# Patient Record
Sex: Female | Born: 1950 | Race: White | Hispanic: No | Marital: Single | Smoking: Never smoker
Health system: Southern US, Community
[De-identification: ages and names within clinical notes are randomized; demographics above are authoritative.]

## PROBLEM LIST (undated history)

## (undated) DIAGNOSIS — IMO0002 Reserved for concepts with insufficient information to code with codable children: Secondary | ICD-10-CM

## (undated) DIAGNOSIS — Z8619 Personal history of other infectious and parasitic diseases: Secondary | ICD-10-CM

## (undated) DIAGNOSIS — Z8719 Personal history of other diseases of the digestive system: Secondary | ICD-10-CM

## (undated) DIAGNOSIS — M810 Age-related osteoporosis without current pathological fracture: Secondary | ICD-10-CM

## (undated) DIAGNOSIS — E785 Hyperlipidemia, unspecified: Secondary | ICD-10-CM

## (undated) HISTORY — DX: Personal history of other diseases of the digestive system: Z87.19

## (undated) HISTORY — DX: Age-related osteoporosis without current pathological fracture: M81.0

## (undated) HISTORY — DX: Reserved for concepts with insufficient information to code with codable children: IMO0002

## (undated) HISTORY — DX: Hyperlipidemia, unspecified: E78.5

## (undated) HISTORY — DX: Personal history of other infectious and parasitic diseases: Z86.19

---

## 1980-03-04 HISTORY — PX: TOTAL ABDOMINAL HYSTERECTOMY W/ BILATERAL SALPINGOOPHORECTOMY: SHX83

## 2003-03-05 HISTORY — PX: APPENDECTOMY: SHX54

## 2004-02-28 ENCOUNTER — Ambulatory Visit: Payer: Self-pay | Admitting: Unknown Physician Specialty

## 2004-03-02 ENCOUNTER — Ambulatory Visit: Payer: Self-pay | Admitting: Unknown Physician Specialty

## 2004-06-01 ENCOUNTER — Ambulatory Visit: Payer: Self-pay | Admitting: Unknown Physician Specialty

## 2004-12-06 ENCOUNTER — Ambulatory Visit: Payer: Self-pay | Admitting: Obstetrics and Gynecology

## 2005-01-24 ENCOUNTER — Other Ambulatory Visit: Payer: Self-pay

## 2005-01-24 ENCOUNTER — Emergency Department: Payer: Self-pay | Admitting: Emergency Medicine

## 2005-04-12 ENCOUNTER — Ambulatory Visit: Payer: Self-pay | Admitting: Internal Medicine

## 2005-12-09 ENCOUNTER — Ambulatory Visit: Payer: Self-pay | Admitting: Obstetrics and Gynecology

## 2006-12-15 ENCOUNTER — Ambulatory Visit: Payer: Self-pay | Admitting: Obstetrics and Gynecology

## 2007-12-17 ENCOUNTER — Ambulatory Visit: Payer: Self-pay | Admitting: Obstetrics and Gynecology

## 2008-12-21 ENCOUNTER — Ambulatory Visit: Payer: Self-pay | Admitting: Obstetrics and Gynecology

## 2009-04-12 ENCOUNTER — Emergency Department: Payer: Self-pay | Admitting: Emergency Medicine

## 2009-12-26 ENCOUNTER — Ambulatory Visit: Payer: Self-pay | Admitting: Obstetrics and Gynecology

## 2011-01-07 ENCOUNTER — Ambulatory Visit: Payer: Self-pay | Admitting: Obstetrics and Gynecology

## 2012-01-08 ENCOUNTER — Ambulatory Visit: Payer: Self-pay | Admitting: Obstetrics and Gynecology

## 2013-01-11 ENCOUNTER — Ambulatory Visit: Payer: Self-pay | Admitting: Obstetrics and Gynecology

## 2013-09-27 ENCOUNTER — Ambulatory Visit (INDEPENDENT_AMBULATORY_CARE_PROVIDER_SITE_OTHER): Payer: BC Managed Care – PPO | Admitting: Adult Health

## 2013-09-27 ENCOUNTER — Encounter: Payer: Self-pay | Admitting: Adult Health

## 2013-09-27 ENCOUNTER — Encounter (INDEPENDENT_AMBULATORY_CARE_PROVIDER_SITE_OTHER): Payer: Self-pay

## 2013-09-27 VITALS — BP 122/85 | HR 69 | Temp 97.9°F | Resp 14 | Ht 62.0 in | Wt 136.5 lb

## 2013-09-27 DIAGNOSIS — E7800 Pure hypercholesterolemia, unspecified: Secondary | ICD-10-CM

## 2013-09-27 DIAGNOSIS — Z1211 Encounter for screening for malignant neoplasm of colon: Secondary | ICD-10-CM

## 2013-09-27 DIAGNOSIS — M810 Age-related osteoporosis without current pathological fracture: Secondary | ICD-10-CM | POA: Insufficient documentation

## 2013-09-27 NOTE — Progress Notes (Signed)
Patient ID: Morgan Gallagher, female   DOB: [birth year], 63 y.o.   MRN: [id number]   Subjective:    Patient ID: Morgan Jaegerhyllis Vey, female    DOB: [birth year], 63 y.o.   MRN: [id number]  HPI  Pt is a 63 y/o female who is here to establish care. Medicare eligible in 12/2013. [facility]. Overall she is feeling well. She has been followed by her GYN for her yearly physical exams up until now. Has not had a PCP and was going to urgent care whenever had acute problem. Reports having stress secondary to caring for her daughter. The daughter is believed to have illness similar to ALS - not diagnosed. [facility]    Past Medical History  Diagnosis Date  . Ulcer   . Hyperlipidemia   . History of chicken pox   . History of diverticulitis   . Osteoporosis     No current outpatient prescriptions on file prior to visit.   No current facility-administered medications on file prior to visit.     Review of Systems  Constitutional: Negative.   HENT: Negative.   Eyes: Negative.   Respiratory: Negative.   Cardiovascular: Negative.   Gastrointestinal: Negative.   Endocrine: Negative.   Genitourinary: Negative.   Musculoskeletal: Negative.   Skin: Negative.   Allergic/Immunologic: Negative.   Neurological: Negative.   Hematological: Negative.   Psychiatric/Behavioral: Negative.        Stress secondary to daughter's illness       Objective:  BP 122/85  Pulse 69  Temp(Src) 97.9 F (36.6 C) (Oral)  Resp 14  Ht 5\' 2"  (1.575 m)  Wt 136 lb 8 oz (61.916 kg)  BMI 24.96 kg/m2  SpO2 99%   Physical Exam  Constitutional: She is oriented to person, place, and time. No distress.  HENT:  Head: Normocephalic and atraumatic.  Eyes: Conjunctivae and EOM are normal.  Neck: Normal range of motion. Neck supple.  Cardiovascular: Normal rate, regular rhythm, normal heart sounds and intact distal pulses.  Exam reveals no gallop and no friction rub.   No  murmur heard. Pulmonary/Chest: Effort normal and breath sounds normal. No respiratory distress. She has no wheezes. She has no rales.  Musculoskeletal: Normal range of motion.  Neurological: She is alert and oriented to person, place, and time. She has normal reflexes. Coordination normal.  Skin: Skin is warm and dry.  Psychiatric: She has a normal mood and affect. Her behavior is normal. Judgment and thought content normal.       Assessment & Plan:   1. Encounter for screening colonoscopy Refer to Dr. Mechele CollinElliott for screening colonocsopy. She has scar tissue from hysterectomy and last colonoscopy was unable to be performed. She reports having barium enema. May consider cologuard. - Ambulatory referral to Gastroenterology  2. OP (osteoporosis) Dexa scan scheduled after September. Hx of taking fosamax for many years.  3. Hypercholesteremia Taking atorvastatin daily. Recently had this checked. Follow.

## 2013-09-27 NOTE — Progress Notes (Signed)
Pre visit review using our clinic review tool, if applicable. No additional management support is needed unless otherwise documented below in the visit note. 

## 2013-09-27 NOTE — Patient Instructions (Signed)
   [facility] [place].  Please schedule your Welcome to Medicare Exam for October.

## 2013-11-10 ENCOUNTER — Telehealth: Payer: Self-pay | Admitting: Adult Health

## 2013-11-10 NOTE — Telephone Encounter (Signed)
Pt request Raquel to call her. She has some questions about appts.

## 2013-11-18 ENCOUNTER — Encounter: Payer: Self-pay | Admitting: Adult Health

## 2013-11-18 ENCOUNTER — Ambulatory Visit (INDEPENDENT_AMBULATORY_CARE_PROVIDER_SITE_OTHER): Payer: BC Managed Care – PPO | Admitting: Adult Health

## 2013-11-18 VITALS — BP 122/83 | HR 75 | Temp 97.7°F | Resp 14 | Ht 62.0 in | Wt 134.8 lb

## 2013-11-18 DIAGNOSIS — Z0000 Encounter for general adult medical examination without abnormal findings: Secondary | ICD-10-CM

## 2013-11-18 DIAGNOSIS — Z23 Encounter for immunization: Secondary | ICD-10-CM

## 2013-11-18 DIAGNOSIS — Z1211 Encounter for screening for malignant neoplasm of colon: Secondary | ICD-10-CM

## 2013-11-18 LAB — CBC WITH DIFFERENTIAL/PLATELET
BASOS ABS: 0 10*3/uL (ref 0.0–0.1)
Basophils Relative: 0.6 % (ref 0.0–3.0)
EOS PCT: 3.6 % (ref 0.0–5.0)
Eosinophils Absolute: 0.3 10*3/uL (ref 0.0–0.7)
HCT: 41.4 % (ref 36.0–46.0)
Hemoglobin: 13.8 g/dL (ref 12.0–15.0)
Lymphocytes Relative: 29.6 % (ref 12.0–46.0)
Lymphs Abs: 2.1 10*3/uL (ref 0.7–4.0)
MCHC: 33.4 g/dL (ref 30.0–36.0)
MCV: 91.9 fl (ref 78.0–100.0)
MONO ABS: 0.8 10*3/uL (ref 0.1–1.0)
Monocytes Relative: 11.5 % (ref 3.0–12.0)
NEUTROS PCT: 54.7 % (ref 43.0–77.0)
Neutro Abs: 3.8 10*3/uL (ref 1.4–7.7)
PLATELETS: 240 10*3/uL (ref 150.0–400.0)
RBC: 4.5 Mil/uL (ref 3.87–5.11)
RDW: 14 % (ref 11.5–15.5)
WBC: 7 10*3/uL (ref 4.0–10.5)

## 2013-11-18 LAB — BASIC METABOLIC PANEL
BUN: 15 mg/dL (ref 6–23)
CHLORIDE: 107 meq/L (ref 96–112)
CO2: 27 meq/L (ref 19–32)
Calcium: 9.4 mg/dL (ref 8.4–10.5)
Creatinine, Ser: 0.6 mg/dL (ref 0.4–1.2)
GFR: 99.02 mL/min (ref >60.00)
Glucose, Bld: 99 mg/dL (ref 70–99)
Potassium: 4.3 mEq/L (ref 3.5–5.1)
SODIUM: 143 meq/L (ref 135–145)

## 2013-11-18 LAB — HEPATIC FUNCTION PANEL
ALBUMIN: 4.5 g/dL (ref 3.5–5.2)
ALK PHOS: 89 U/L (ref 39–117)
ALT: 22 U/L (ref 0–35)
AST: 26 U/L (ref 0–37)
BILIRUBIN DIRECT: 0.1 mg/dL (ref 0.0–0.3)
TOTAL PROTEIN: 7.7 g/dL (ref 6.0–8.3)
Total Bilirubin: 0.5 mg/dL (ref 0.2–1.2)

## 2013-11-18 LAB — LIPID PANEL
CHOLESTEROL: 170 mg/dL (ref 0–200)
HDL: 48.2 mg/dL (ref >39.00)
LDL Cholesterol: 98 mg/dL (ref 0–99)
NonHDL: 121.8
Total CHOL/HDL Ratio: 4
Triglycerides: 119 mg/dL (ref 0.0–149.0)
VLDL: 23.8 mg/dL (ref 0.0–40.0)

## 2013-11-18 LAB — VITAMIN D 25 HYDROXY (VIT D DEFICIENCY, FRACTURES): VITD: 47.42 ng/mL (ref 30.00–100.00)

## 2013-11-18 LAB — VITAMIN B12: VITAMIN B 12: 853 pg/mL (ref 211–911)

## 2013-11-18 LAB — TSH: TSH: 0.96 u[IU]/mL (ref 0.35–4.50)

## 2013-11-18 MED ORDER — ATORVASTATIN CALCIUM 10 MG PO TABS: 10.0000 mg | ORAL_TABLET | Freq: Every day | ORAL | Status: DC

## 2013-11-18 NOTE — Patient Instructions (Signed)
  Please have your labs drawn prior to leaving the office today. We will contact you with the results. We can have them mailed to you or you could come by the office and get a copy for your insurance.  I am referring you for:   Bone Density  Mammogram  GI - screening colonoscopy  Please see Eber Jones to schedule the bone density and mammogram. Dr. Earnest Conroy office should contact you for an appointment. If you have not heard back within 2 weeks, please call our office for a status on your referral.  For your insurance, list Dr. Ronna Polio as your primary physician  You received your tetanus and flu vaccines today.

## 2013-11-18 NOTE — Progress Notes (Signed)
Pre visit review using our clinic review tool, if applicable. No additional management support is needed unless otherwise documented below in the visit note. 

## 2013-11-18 NOTE — Progress Notes (Signed)
Patient ID: Morgan Gallagher, female   DOB: September 27, 1950, 63 y.o.   MRN: [id number]   Subjective:    Patient ID: Morgan Gallagher, female    DOB: 25-Jul-1950, 63 y.o.   MRN: [id number]  HPI  Pt is a very pleasant 63 y/o female who presents to clinic for her annual physical including labs. She is due for her colonoscopy, mammogram, bone density, tetanus vaccine and flu vaccine. She had her shingles vaccine in November 2014. Pt is feeling well overall. Some stress related to her daughter's health but she is coping well.  She is due to renew her health insurance next month and will need a physician to list from our practice as a PCP. I have discussed this with Dr. Dan Humphreys who has agreed that patient list her as PCP since I am leaving the practice.    Past Medical History  Diagnosis Date  . Ulcer   . Hyperlipidemia   . History of chicken pox   . History of diverticulitis   . Osteoporosis      Past Surgical History  Procedure Laterality Date  . Appendectomy  2005  . Total abdominal hysterectomy w/ bilateral salpingoophorectomy  1982     Family History  Problem Relation Age of Onset  . Hyperlipidemia Mother   . Diabetes Mother   . Stroke Mother   . Hyperlipidemia Father   . Heart disease Father 93    died  . Alcohol abuse Father   . Cancer Maternal Aunt     ovarian  . Hypertension Paternal Aunt   . Hyperlipidemia Maternal Grandmother   . Early death Brother 3    Run over by his mother     History   Social History  . Marital Status: Divorced    Spouse Name: N/A    Number of Children: 2  . Years of Education: 13   Occupational History  . Teacher Retail buyer   Social History Main Topics  . Smoking status: Never Smoker   . Smokeless tobacco: Never Used  . Alcohol Use: No  . Drug Use: No  . Sexual Activity: Not on file   Other Topics Concern  . Not on file   Social History Narrative   Winta grew up in [place]. She is divorced as has 2  daughter [name], [name]). She lives in [place]. [name] is living with her 2/2 illness (possible ALS). She works as a Research officer, trade [employer].       Hobbies: stainglass          Current Outpatient Prescriptions on File Prior to Visit  Medication Sig Dispense Refill  . Calcium Carbonate-Vit D-Min (CALTRATE 600+D PLUS PO) Take 1 tablet by mouth 2 (two) times daily.      . Flaxseed, Linseed, (FLAXSEED OIL) 1000 MG CAPS Take 1 capsule by mouth daily.      . Multiple Vitamins-Minerals (CENTRUM SILVER ADULT 50+ PO) Take 1 tablet by mouth daily.      . Omega-3 Fatty Acids (FISH OIL) 1200 MG CAPS Take 1 capsule by mouth daily.      . psyllium (METAMUCIL) 58.6 % powder Take 1 packet by mouth daily.      . vitamin E 400 UNIT capsule Take 400 Units by mouth daily.       No current facility-administered medications on file prior to visit.     Review of Systems  Constitutional: Negative.   HENT: Negative.   Eyes: Negative.   Respiratory: Negative.  Cardiovascular: Negative.   Gastrointestinal:       Occasional indigestion which she takes OTC meds with good control.  Endocrine: Negative.   Genitourinary: Negative.   Musculoskeletal: Negative.   Skin: Negative.   Allergic/Immunologic: Negative.   Neurological: Negative.   Hematological: Negative.   Psychiatric/Behavioral: Negative.        Objective:  BP 122/83  Pulse 75  Temp(Src) 97.7 F (36.5 C) (Oral)  Resp 14  Ht  (1.575 m)  Wt 134 lb 12 oz (61.122 kg)  BMI 24.64 kg/m2  SpO2 100%   Physical Exam  Constitutional: She is oriented to person, place, and time. She appears well-developed and well-nourished. No distress.  HENT:  Head: Normocephalic and atraumatic.  Right Ear: External ear normal.  Left Ear: External ear normal.  Nose: Nose normal.  Mouth/Throat: Oropharynx is clear and moist.  Eyes: Conjunctivae and EOM are normal. Pupils are equal, round, and reactive to light.  Neck: Normal range of motion. Neck supple.  No tracheal deviation present. No thyromegaly present.  Cardiovascular: Normal rate, regular rhythm, normal heart sounds and intact distal pulses.  Exam reveals no gallop and no friction rub.   No murmur heard. Pulmonary/Chest: Effort normal and breath sounds normal. No respiratory distress. She has no wheezes. She has no rales. Right breast exhibits no inverted nipple, no mass, no nipple discharge, no skin change and no tenderness. Left breast exhibits no inverted nipple, no mass, no nipple discharge, no skin change and no tenderness. Breasts are symmetrical.  Abdominal: Soft. Bowel sounds are normal. She exhibits no distension and no mass. There is no tenderness. There is no rebound and no guarding.  Musculoskeletal: Normal range of motion. She exhibits no edema and no tenderness.  Lymphadenopathy:    She has no cervical adenopathy.  Neurological: She is alert and oriented to person, place, and time. She has normal reflexes. No cranial nerve deficit. Coordination normal.  Skin: Skin is warm and dry.  Psychiatric: She has a normal mood and affect. Her behavior is normal. Judgment and thought content normal.      Assessment & Plan:   1. Routine general medical examination at a health care facility Normal physical exam. Screenings addressed separately. Breast exam normal. Check labs - MM DIGITAL SCREENING BILATERAL; Future - TSH - Vit D  25 hydroxy (rtn osteoporosis monitoring) - CBC with Differential - Lipid panel - Vitamin B12 - Basic metabolic panel - Hepatic function panel - DG Bone Density; Future  2. Screening for colon cancer Refer to Dr. Mechele Collin. Pt has seen him in the past for her colonoscopies - Ambulatory referral to Gastroenterology  3. Need for prophylactic vaccination and inoculation against influenza Given in clinic - Flu Vaccine QUAD 36+ mos PF IM (Fluarix Quad PF)  4. Need for TD vaccine Given in clinic - Td vaccine preservative free greater than or equal to 7yo  IM

## 2013-11-19 ENCOUNTER — Encounter: Payer: Self-pay | Admitting: *Deleted

## 2014-01-09 LAB — COLOGUARD

## 2014-01-13 ENCOUNTER — Ambulatory Visit: Payer: Self-pay | Admitting: Adult Health

## 2014-01-16 ENCOUNTER — Telehealth: Payer: Self-pay | Admitting: Internal Medicine

## 2014-01-16 NOTE — Telephone Encounter (Signed)
This is formerly Raquel's patient but Her mammogram was abnormal on the right.  They  want additional images, which I have ordered.  I prefer to choose the surgeon with the patient if a biopsy is recommended, so if they tell her she needs a biopsy, she can tell them she is going to discuss it with me first.

## 2014-01-17 ENCOUNTER — Ambulatory Visit: Payer: Self-pay | Admitting: Adult Health

## 2014-01-17 ENCOUNTER — Encounter: Payer: Self-pay | Admitting: *Deleted

## 2014-01-17 LAB — HM DEXA SCAN

## 2014-01-17 NOTE — Telephone Encounter (Signed)
Spoke to patient to notify her of Dr. Melina Schoolsullo's comments. Patient verbalized understanding. Patient stated that she is concerned about them needing additional images. I advised patient not to worry and we will contact her after we receive the images. Patient felt better before we got off the phone.

## 2014-01-17 NOTE — Telephone Encounter (Signed)
Left message for patient to return call to office. 

## 2014-01-18 ENCOUNTER — Ambulatory Visit: Payer: Self-pay | Admitting: Adult Health

## 2014-01-20 ENCOUNTER — Telehealth: Payer: Self-pay | Admitting: Internal Medicine

## 2014-01-20 DIAGNOSIS — M810 Age-related osteoporosis without current pathological fracture: Secondary | ICD-10-CM

## 2014-01-20 NOTE — Assessment & Plan Note (Signed)
DEXA scan done Nov 2015 shows T scores may have improved and are now -2/0 i the spine  And - 1.7 in the hip.  continue current therapy , make appt with Naomie Deanarrie Doss to review

## 2014-01-20 NOTE — Telephone Encounter (Signed)
Bone Density study done Nov 2015 shows T scores may have improved and are now -2/0 i the spine  And - 1.7 in the hip.  continue current therapy , make appt with Naomie Deanarrie Doss to review

## 2014-01-21 NOTE — Telephone Encounter (Signed)
Spoke to patient to notify her of Dr. Melina Schoolsullo's comments. Patient verbalized understanding. Patient stated she does not want to schedule an appt at this time. Will call the office when she is ready.

## 2014-02-03 ENCOUNTER — Encounter: Payer: Self-pay | Admitting: Adult Health

## 2014-02-10 ENCOUNTER — Encounter: Payer: Self-pay | Admitting: Adult Health

## 2014-02-23 ENCOUNTER — Encounter: Payer: Self-pay | Admitting: Adult Health

## 2014-06-06 ENCOUNTER — Other Ambulatory Visit: Payer: Self-pay

## 2014-06-06 MED ORDER — ATORVASTATIN CALCIUM 10 MG PO TABS: 10.0000 mg | ORAL_TABLET | Freq: Every day | ORAL | Status: DC

## 2014-11-21 ENCOUNTER — Ambulatory Visit (INDEPENDENT_AMBULATORY_CARE_PROVIDER_SITE_OTHER): Payer: BC Managed Care – PPO | Admitting: Nurse Practitioner

## 2014-11-21 ENCOUNTER — Encounter: Payer: Self-pay | Admitting: Nurse Practitioner

## 2014-11-21 VITALS — BP 120/78 | HR 71 | Temp 98.1°F | Resp 14 | Ht 62.0 in | Wt 138.8 lb

## 2014-11-21 DIAGNOSIS — Z23 Encounter for immunization: Secondary | ICD-10-CM

## 2014-11-21 DIAGNOSIS — F4323 Adjustment disorder with mixed anxiety and depressed mood: Secondary | ICD-10-CM

## 2014-11-21 DIAGNOSIS — K219 Gastro-esophageal reflux disease without esophagitis: Secondary | ICD-10-CM | POA: Diagnosis not present

## 2014-11-21 DIAGNOSIS — Z0000 Encounter for general adult medical examination without abnormal findings: Secondary | ICD-10-CM | POA: Diagnosis not present

## 2014-11-21 DIAGNOSIS — M810 Age-related osteoporosis without current pathological fracture: Secondary | ICD-10-CM

## 2014-11-21 DIAGNOSIS — E7800 Pure hypercholesterolemia, unspecified: Secondary | ICD-10-CM

## 2014-11-21 LAB — COMPREHENSIVE METABOLIC PANEL
ALT: 20 U/L (ref 0–35)
AST: 23 U/L (ref 0–37)
Albumin: 4.4 g/dL (ref 3.5–5.2)
Alkaline Phosphatase: 88 U/L (ref 39–117)
BUN: 17 mg/dL (ref 6–23)
CALCIUM: 9.7 mg/dL (ref 8.4–10.5)
CHLORIDE: 107 meq/L (ref 96–112)
CO2: 28 meq/L (ref 19–32)
CREATININE: 0.72 mg/dL (ref 0.40–1.20)
GFR: 86.16 mL/min (ref >60.00)
Glucose, Bld: 97 mg/dL (ref 70–99)
Potassium: 4.8 mEq/L (ref 3.5–5.1)
SODIUM: 142 meq/L (ref 135–145)
Total Bilirubin: 0.4 mg/dL (ref 0.2–1.2)
Total Protein: 7.6 g/dL (ref 6.0–8.3)

## 2014-11-21 LAB — LIPID PANEL
CHOL/HDL RATIO: 3
Cholesterol: 182 mg/dL (ref 0–200)
HDL: 54.7 mg/dL (ref >39.00)
LDL CALC: 100 mg/dL — AB (ref 0–99)
NonHDL: 126.99
TRIGLYCERIDES: 135 mg/dL (ref 0.0–149.0)
VLDL: 27 mg/dL (ref 0.0–40.0)

## 2014-11-21 LAB — VITAMIN D 25 HYDROXY (VIT D DEFICIENCY, FRACTURES): VITD: 47.4 ng/mL (ref 30.00–100.00)

## 2014-11-21 MED ORDER — ATORVASTATIN CALCIUM 10 MG PO TABS: 10.0000 mg | ORAL_TABLET | Freq: Every day | ORAL | Status: DC

## 2014-11-21 NOTE — Progress Notes (Signed)
Patient ID: Morgan Gallagher, female    DOB: Mar 18, 1950  Age: 64 y.o. MRN: [id number]  CC: Annual Exam   HPI [name] presents for annual physical exam.   1) Health Maintenance-   Diet- Working on healthy habits  Exercise- 10,000 steps daily, goes to classes and walks  Immunizations- Flu- will obtain today    Zostavax- 2014    Pneumonia-   Mammogram- 2015, due in Nov.   Pap- Hysterectomy   Bone Density- 2015  Colonoscopy- Cologuard negative in 2015, repeat in 3 years  Eye Exam- Not UTD  Dental Exam- UTD  Hep C and HIV screening- Declined  Refills: Lipitor- 1 yr requesting  2) Friday felt sick on her stomach and went home from work. Saturday morning took mylanta. Hx of ulcer in 1980's due to stress and raw vegetables. Belching more often and epigastric pain.  Very stressed out recently due to impending retirement, she is estranged from her brother and sister due to finances from Mother's will. Mother passed earlier this year. Reports feeling overwhelmed, but has a good supportive relationship with husband.   History Dickie has a past medical history of Ulcer; Hyperlipidemia; History of chicken pox; History of diverticulitis; and Osteoporosis.   She has past surgical history that includes Appendectomy (2005) and Total abdominal hysterectomy w/ bilateral salpingoophorectomy (4098).   Her family history includes Alcohol abuse in her father; Cancer in her maternal aunt; Diabetes in her mother; Early death (age of onset: 3) in her brother; Heart disease (age of onset: 82) in her father; Hyperlipidemia in her father, maternal grandmother, and mother; Hypertension in her paternal aunt; Stroke in her mother.She reports that she has never smoked. She has never used smokeless tobacco. She reports that she does not drink alcohol or use illicit drugs.  Outpatient Prescriptions Prior to Visit  Medication Sig Dispense Refill  . Ascorbic Acid (VITAMIN C) 1000 MG tablet Take 1,000 mg by  mouth daily.    . Calcium Carbonate-Vit D-Min (CALTRATE 600+D PLUS PO) Take 1 tablet by mouth 2 (two) times daily.    . Flaxseed, Linseed, (FLAXSEED OIL) 1000 MG CAPS Take 1 capsule by mouth daily.    . Multiple Vitamins-Minerals (CENTRUM SILVER ADULT 50+ PO) Take 1 tablet by mouth daily.    . Omega-3 Fatty Acids (FISH OIL) 1200 MG CAPS Take 1 capsule by mouth daily.    . psyllium (METAMUCIL) 58.6 % powder Take 1 packet by mouth daily.    . vitamin E 400 UNIT capsule Take 400 Units by mouth daily.    Marland Kitchen atorvastatin (LIPITOR) 10 MG tablet Take 1 tablet (10 mg total) by mouth daily. 30 tablet 5   No facility-administered medications prior to visit.    ROS Review of Systems  Constitutional: Negative for fever, chills, diaphoresis, fatigue and unexpected weight change.  HENT: Negative for tinnitus and trouble swallowing.   Eyes: Negative for visual disturbance.  Respiratory: Negative for cough, chest tightness and wheezing.   Cardiovascular: Negative for chest pain, palpitations and leg swelling.  Gastrointestinal: Positive for vomiting and abdominal pain. Negative for nausea, diarrhea, constipation, blood in stool and abdominal distention.       Vomited once end of last week Patient reporting increased acid reflux Epigastric pain  Genitourinary: Negative for dysuria, hematuria, vaginal discharge and vaginal pain.  Musculoskeletal: Negative for myalgias, back pain, arthralgias and gait problem.  Skin: Negative for color change and rash.  Neurological: Negative for dizziness, weakness, numbness and headaches.  Hematological:  Does not bruise/bleed easily.  Psychiatric/Behavioral: Negative for suicidal ideas and sleep disturbance. The patient is nervous/anxious.    Objective:  BP 120/78 mmHg  Pulse 71  Temp(Src) 98.1 F (36.7 C)  Resp 14  Ht  (1.575 m)  Wt 138 lb 12.8 oz (62.959 kg)  BMI 25.38 kg/m2  SpO2 96%  Physical Exam  Constitutional: She is oriented to person, place,  and time. She appears well-developed and well-nourished. No distress.  HENT:  Head: Normocephalic and atraumatic.  Right Ear: External ear normal.  Left Ear: External ear normal.  Nose: Nose normal.  Mouth/Throat: Oropharynx is clear and moist. No oropharyngeal exudate.  TMs and canals clear bilaterally  Eyes: Conjunctivae and EOM are normal. Pupils are equal, round, and reactive to light. Right eye exhibits no discharge. Left eye exhibits no discharge. No scleral icterus.  Neck: Normal range of motion. Neck supple. No thyromegaly present.  Cardiovascular: Normal rate, regular rhythm, normal heart sounds and intact distal pulses.  Exam reveals no gallop and no friction rub.   No murmur heard. Pulmonary/Chest: Effort normal and breath sounds normal. No respiratory distress. She has no wheezes. She has no rales. She exhibits no tenderness.  No significant findings on breast exam  Abdominal: Soft. Bowel sounds are normal. She exhibits no distension and no mass. There is no tenderness. There is no rebound and no guarding.  Genitourinary:  Deferred due to age and denying symptoms  Musculoskeletal: Normal range of motion. She exhibits no edema or tenderness.  Lymphadenopathy:    She has no cervical adenopathy.  Neurological: She is alert and oriented to person, place, and time. She has normal reflexes. No cranial nerve deficit. She exhibits normal muscle tone. Coordination normal.  Skin: Skin is warm and dry. No rash noted. She is not diaphoretic. No erythema. No pallor.  Psychiatric: She has a normal mood and affect. Her behavior is normal. Judgment and thought content normal.  Tearful    Assessment & Plan:   [name] was seen today for annual exam.  Diagnoses and all orders for this visit:  Routine general medical examination at a health care facility -     CBC with Differential/Platelet -     Comprehensive metabolic panel -     Hemoglobin A1c -     Lipid panel -     Vit D  25 hydroxy  (rtn osteoporosis monitoring)  Osteoporosis -     Vit D  25 hydroxy (rtn osteoporosis monitoring)  OP (osteoporosis)  Adjustment disorder with mixed anxiety and depressed mood  Gastroesophageal reflux disease without esophagitis  Hypercholesteremia  Other orders -     atorvastatin (LIPITOR) 10 MG tablet; Take 1 tablet (10 mg total) by mouth daily.   I am having Ms. Oros maintain her Flaxseed Oil, Multiple Vitamins-Minerals (CENTRUM SILVER ADULT 50+ PO), vitamin E, Fish Oil, Calcium Carbonate-Vit D-Min (CALTRATE 600+D PLUS PO), psyllium, vitamin C, and atorvastatin.  Meds ordered this encounter  Medications  . atorvastatin (LIPITOR) 10 MG tablet    Sig: Take 1 tablet (10 mg total) by mouth daily.    Dispense:  30 tablet    Refill:  11    Order Specific Question:  Supervising Provider    Answer:  Sherlene Shams [[id number]]     Follow-up: Return in about 1 year (around 11/21/2015) for CPE with labs.

## 2014-11-21 NOTE — Progress Notes (Signed)
Pre visit review using our clinic review tool, if applicable. No additional management support is needed unless otherwise documented below in the visit note. 

## 2014-11-21 NOTE — Assessment & Plan Note (Signed)
Pt declines any intervention at this time. She wants to try food elimination and mylanta as needed. I suggested Zantac OTC for reflux and she was interested in trying this. AVS has food list for pt to review that may influence reflux. Will follow.

## 2014-11-21 NOTE — Assessment & Plan Note (Signed)
Stable on Lipitor and omega 3s. She is working on diet and exercise to lose the 4 lbs she has gained this year. Will follow

## 2014-11-21 NOTE — Assessment & Plan Note (Signed)
Discussed with pt. She is happy with progress. She is taking vitamins.

## 2014-11-21 NOTE — Patient Instructions (Signed)
Food Choices for Gastroesophageal Reflux Disease When you have gastroesophageal reflux disease (GERD), the foods you eat and your eating habits are very important. Choosing the right foods can help ease the discomfort of GERD. WHAT GENERAL GUIDELINES DO I NEED TO FOLLOW?  Choose fruits, vegetables, whole grains, low-fat dairy products, and low-fat meat, fish, and poultry.  Limit fats such as oils, salad dressings, butter, nuts, and avocado.  Keep a food diary to identify foods that cause symptoms.  Avoid foods that cause reflux. These may be different for different people.  Eat frequent small meals instead of three large meals each day.  Eat your meals slowly, in a relaxed setting.  Limit fried foods.  Cook foods using methods other than frying.  Avoid drinking alcohol.  Avoid drinking large amounts of liquids with your meals.  Avoid bending over or lying down until 2-3 hours after eating. WHAT FOODS ARE NOT RECOMMENDED? The following are some foods and drinks that may worsen your symptoms: Vegetables Tomatoes. Tomato juice. Tomato and spaghetti sauce. Chili peppers. Onion and garlic. Horseradish. Fruits Oranges, grapefruit, and lemon (fruit and juice). Meats High-fat meats, fish, and poultry. This includes hot dogs, ribs, ham, sausage, salami, and bacon. Dairy Whole milk and chocolate milk. Sour cream. Cream. Butter. Ice cream. Cream cheese.  Beverages Coffee and tea, with or without caffeine. Carbonated beverages or energy drinks. Condiments Hot sauce. Barbecue sauce.  Sweets/Desserts Chocolate and cocoa. Donuts. Peppermint and spearmint. Fats and Oils High-fat foods, including [place] fries and potato chips. Other Vinegar. Strong spices, such as black pepper, white pepper, red pepper, cayenne, curry powder, cloves, ginger, and chili powder. The items listed above may not be a complete list of foods and beverages to avoid. Contact your dietitian for more  information.   Health Maintenance Adopting a healthy lifestyle and getting preventive care can go a long way to promote health and wellness. Talk with your health care provider about what schedule of regular examinations is right for you. This is a good chance for you to check in with your provider about disease prevention and staying healthy. In between checkups, there are plenty of things you can do on your own. Experts have done a lot of research about which lifestyle changes and preventive measures are most likely to keep you healthy. Ask your health care provider for more information. WEIGHT AND DIET  Eat a healthy diet  Be sure to include plenty of vegetables, fruits, low-fat dairy products, and lean protein.  Do not eat a lot of foods high in solid fats, added sugars, or salt.  Get regular exercise. This is one of the most important things you can do for your health.  Most adults should exercise for at least 150 minutes each week. The exercise should increase your heart rate and make you sweat (moderate-intensity exercise).  Most adults should also do strengthening exercises at least twice a week. This is in addition to the moderate-intensity exercise.  Maintain a healthy weight  Body mass index (BMI) is a measurement that can be used to identify possible weight problems. It estimates body fat based on height and weight. Your health care provider can help determine your BMI and help you achieve or maintain a healthy weight.  For females 13 years of age and older:   A BMI below 18.5 is considered underweight.  A BMI of 18.5 to 24.9 is normal.  A BMI of 25 to 29.9 is considered overweight.  A BMI of 30 and above  is considered obese.  Watch levels of cholesterol and blood lipids  You should start having your blood tested for lipids and cholesterol at 66 years of age, then have this test every 5 years.  You may need to have your cholesterol levels checked more often  if:  Your lipid or cholesterol levels are high.  You are older than 66 years of age.  You are at high risk for heart disease.  CANCER SCREENING   Lung Cancer  Lung cancer screening is recommended for adults 47-67 years old who are at high risk for lung cancer because of a history of smoking.  A yearly low-dose CT scan of the lungs is recommended for people who:  Currently smoke.  Have quit within the past 15 years.  Have at least a 30-pack-year history of smoking. A pack year is smoking an average of one pack of cigarettes a day for 1 year.  Yearly screening should continue until it has been 15 years since you quit.  Yearly screening should stop if you develop a health problem that would prevent you from having lung cancer treatment.  Breast Cancer  Practice breast self-awareness. This means understanding how your breasts normally appear and feel.  It also means doing regular breast self-exams. Let your health care provider know about any changes, no matter how small.  If you are in your 20s or 30s, you should have a clinical breast exam (CBE) by a health care provider every 1-3 years as part of a regular health exam.  If you are 59 or older, have a CBE every year. Also consider having a breast X-ray (mammogram) every year.  If you have a family history of breast cancer, talk to your health care provider about genetic screening.  If you are at high risk for breast cancer, talk to your health care provider about having an MRI and a mammogram every year.  Breast cancer gene (BRCA) assessment is recommended for women who have family members with BRCA-related cancers. BRCA-related cancers include:  Breast.  Ovarian.  Tubal.  Peritoneal cancers.  Results of the assessment will determine the need for genetic counseling and BRCA1 and BRCA2 testing. Cervical Cancer Routine pelvic examinations to screen for cervical cancer are no longer recommended for nonpregnant women  who are considered low risk for cancer of the pelvic organs (ovaries, uterus, and vagina) and who do not have symptoms. A pelvic examination may be necessary if you have symptoms including those associated with pelvic infections. Ask your health care provider if a screening pelvic exam is right for you.   The Pap test is the screening test for cervical cancer for women who are considered at risk.  If you had a hysterectomy for a problem that was not cancer or a condition that could lead to cancer, then you no longer need Pap tests.  If you are older than 65 years, and you have had normal Pap tests for the past 10 years, you no longer need to have Pap tests.  If you have had past treatment for cervical cancer or a condition that could lead to cancer, you need Pap tests and screening for cancer for at least 20 years after your treatment.  If you no longer get a Pap test, assess your risk factors if they change (such as having a new sexual partner). This can affect whether you should start being screened again.  Some women have medical problems that increase their chance of getting cervical cancer. If this  is the case for you, your health care provider may recommend more frequent screening and Pap tests.  The human papillomavirus (HPV) test is another test that may be used for cervical cancer screening. The HPV test looks for the virus that can cause cell changes in the cervix. The cells collected during the Pap test can be tested for HPV.  The HPV test can be used to screen women 45 years of age and older. Getting tested for HPV can extend the interval between normal Pap tests from three to five years.  An HPV test also should be used to screen women of any age who have unclear Pap test results.  After 67 years of age, women should have HPV testing as often as Pap tests.  Colorectal Cancer  This type of cancer can be detected and often prevented.  Routine colorectal cancer screening usually  begins at 67 years of age and continues through 66 years of age.  Your health care provider may recommend screening at an earlier age if you have risk factors for colon cancer.  Your health care provider may also recommend using home test kits to check for hidden blood in the stool.  A small camera at the end of a tube can be used to examine your colon directly (sigmoidoscopy or colonoscopy). This is done to check for the earliest forms of colorectal cancer.  Routine screening usually begins at age 32.  Direct examination of the colon should be repeated every 5-10 years through 66 years of age. However, you may need to be screened more often if early forms of precancerous polyps or small growths are found. Skin Cancer  Check your skin from head to toe regularly.  Tell your health care provider about any new moles or changes in moles, especially if there is a change in a mole's shape or color.  Also tell your health care provider if you have a mole that is larger than the size of a pencil eraser.  Always use sunscreen. Apply sunscreen liberally and repeatedly throughout the day.  Protect yourself by wearing long sleeves, pants, a wide-brimmed hat, and sunglasses whenever you are outside. HEART DISEASE, DIABETES, AND HIGH BLOOD PRESSURE   Have your blood pressure checked at least every 1-2 years. High blood pressure causes heart disease and increases the risk of stroke.  If you are between 17 years and 45 years old, ask your health care provider if you should take aspirin to prevent strokes.  Have regular diabetes screenings. This involves taking a blood sample to check your fasting blood sugar level.  If you are at a normal weight and have a low risk for diabetes, have this test once every three years after 66 years of age.  If you are overweight and have a high risk for diabetes, consider being tested at a younger age or more often. PREVENTING INFECTION  Hepatitis B  If you have a  higher risk for hepatitis B, you should be screened for this virus. You are considered at high risk for hepatitis B if:  You were born in a country where hepatitis B is common. Ask your health care provider which countries are considered high risk.  Your parents were born in a high-risk country, and you have not been immunized against hepatitis B (hepatitis B vaccine).  You have HIV or AIDS.  You use needles to inject street drugs.  You live with someone who has hepatitis B.  You have had sex with someone who  has hepatitis B.  You get hemodialysis treatment.  You take certain medicines for conditions, including cancer, organ transplantation, and autoimmune conditions. Hepatitis C  Blood testing is recommended for:  Everyone born from [birth year] through 1967.  Anyone with known risk factors for hepatitis C. Sexually transmitted infections (STIs)  You should be screened for sexually transmitted infections (STIs) including gonorrhea and chlamydia if:  You are sexually active and are younger than 66 years of age.  You are older than 66 years of age and your health care provider tells you that you are at risk for this type of infection.  Your sexual activity has changed since you were last screened and you are at an increased risk for chlamydia or gonorrhea. Ask your health care provider if you are at risk.  If you do not have HIV, but are at risk, it may be recommended that you take a prescription medicine daily to prevent HIV infection. This is called pre-exposure prophylaxis (PrEP). You are considered at risk if:  You are sexually active and do not regularly use condoms or know the HIV status of your partner(s).  You take drugs by injection.  You are sexually active with a partner who has HIV. Talk with your health care provider about whether you are at high risk of being infected with HIV. If you choose to begin PrEP, you should first be tested for HIV. You should then be tested  every 3 months for as long as you are taking PrEP.  PREGNANCY   If you are premenopausal and you may become pregnant, ask your health care provider about preconception counseling.  If you may become pregnant, take 400 to 800 micrograms (mcg) of folic acid every day.  If you want to prevent pregnancy, talk to your health care provider about birth control (contraception). OSTEOPOROSIS AND MENOPAUSE   Osteoporosis is a disease in which the bones lose minerals and strength with aging. This can result in serious bone fractures. Your risk for osteoporosis can be identified using a bone density scan.  If you are 16 years of age or older, or if you are at risk for osteoporosis and fractures, ask your health care provider if you should be screened.  Ask your health care provider whether you should take a calcium or vitamin D supplement to lower your risk for osteoporosis.  Menopause may have certain physical symptoms and risks.  Hormone replacement therapy may reduce some of these symptoms and risks. Talk to your health care provider about whether hormone replacement therapy is right for you.  HOME CARE INSTRUCTIONS   Schedule regular health, dental, and eye exams.  Stay current with your immunizations.   Do not use any tobacco products including cigarettes, chewing tobacco, or electronic cigarettes.  If you are pregnant, do not drink alcohol.  If you are breastfeeding, limit how much and how often you drink alcohol.  Limit alcohol intake to no more than 1 drink per day for nonpregnant women. One drink equals 12 ounces of beer, 5 ounces of wine, or 1 ounces of hard liquor.  Do not use street drugs.  Do not share needles.  Ask your health care provider for help if you need support or information about quitting drugs.  Tell your health care provider if you often feel depressed.  Tell your health care provider if you have ever been abused or do not feel safe at home. Document  Released: 09/03/2010 Document Revised: 07/05/2013 Document Reviewed: 01/20/2013 Exit[employer] Patient Information 2015  [employer]. This information is not intended to replace advice given to you by your health care provider. Make sure you discuss any questions you have with your health care provider.

## 2014-11-21 NOTE — Assessment & Plan Note (Addendum)
Patient is experiencing a hard year with multiple stressors. She is tearful. She has support. She declines counseling or help with medications at this time. I let her know that we are here any time she needs [place]

## 2014-11-21 NOTE — Assessment & Plan Note (Signed)
Discussed acute and chronic issues. Reviewed health maintenance measures, PFSHx, and immunizations. Obtain routine labs Vitamin D, Lipid panel, CBC w/ diff, A1c, and CMET.   Breast exam- clinical- completed.  Mammogram due in Nov.  No PAP due to age and hysterectomy Declined Hep C and HIV screening today Declined Pna vaccine Accepted Flu vaccine

## 2014-11-25 LAB — CBC WITH DIFFERENTIAL/PLATELET
BASOS PCT: 0.6 % (ref 0.0–3.0)
Basophils Absolute: 0 10*3/uL (ref 0.0–0.1)
EOS PCT: 6.2 % — AB (ref 0.0–5.0)
Eosinophils Absolute: 0.5 10*3/uL (ref 0.0–0.7)
HEMATOCRIT: 44.2 % (ref 36.0–46.0)
HEMOGLOBIN: 14.6 g/dL (ref 12.0–15.0)
LYMPHS PCT: 22.6 % (ref 12.0–46.0)
Lymphs Abs: 1.8 10*3/uL (ref 0.7–4.0)
MCHC: 33 g/dL (ref 30.0–36.0)
MCV: 93.5 fl (ref 78.0–100.0)
MONOS PCT: 9.7 % (ref 3.0–12.0)
Monocytes Absolute: 0.8 10*3/uL (ref 0.1–1.0)
Neutro Abs: 4.9 10*3/uL (ref 1.4–7.7)
Neutrophils Relative %: 60.9 % (ref 43.0–77.0)
Platelets: 231 10*3/uL (ref 150.0–400.0)
RBC: 4.73 Mil/uL (ref 3.87–5.11)
RDW: 13.6 % (ref 11.5–15.5)
WBC: 8.1 10*3/uL (ref 4.0–10.5)

## 2014-11-25 LAB — HEMOGLOBIN A1C: Hgb A1c MFr Bld: 5.7 % (ref 4.6–6.5)

## 2014-12-27 ENCOUNTER — Telehealth: Payer: Self-pay

## 2014-12-27 ENCOUNTER — Other Ambulatory Visit: Payer: Self-pay | Admitting: Nurse Practitioner

## 2014-12-27 DIAGNOSIS — Z1239 Encounter for other screening for malignant neoplasm of breast: Secondary | ICD-10-CM

## 2014-12-27 NOTE — Telephone Encounter (Signed)
Melissa gave me a paper and told me the pt needs new order for mammogram, thank you

## 2015-01-16 ENCOUNTER — Ambulatory Visit
Admission: RE | Admit: 2015-01-16 | Discharge: 2015-01-16 | Disposition: A | Discharge status: Home / Self Care | Payer: BC Managed Care – PPO | Source: Ambulatory Visit | Attending: Nurse Practitioner | Admitting: Nurse Practitioner

## 2015-01-16 ENCOUNTER — Ambulatory Visit

## 2015-01-16 DIAGNOSIS — Z1239 Encounter for other screening for malignant neoplasm of breast: Secondary | ICD-10-CM

## 2015-01-16 DIAGNOSIS — Z1231 Encounter for screening mammogram for malignant neoplasm of breast: Secondary | ICD-10-CM | POA: Diagnosis not present

## 2015-01-17 ENCOUNTER — Telehealth: Payer: Self-pay | Admitting: Internal Medicine

## 2015-08-28 ENCOUNTER — Telehealth: Payer: Self-pay | Admitting: *Deleted

## 2015-08-28 NOTE — Telephone Encounter (Signed)
Are you taking new patients?

## 2015-08-28 NOTE — Telephone Encounter (Signed)
Patient has requested to have Dr. Lorin PicketScott as her PCP, when Dr. Dan HumphreysWalker leave. Please advise

## 2015-08-29 NOTE — Telephone Encounter (Signed)
Left a voicemail to call office, to schedule a follow up appt with Dr.Scott

## 2015-08-29 NOTE — Telephone Encounter (Signed)
Ok to take as a new pt.  Thanks

## 2015-08-29 NOTE — Telephone Encounter (Signed)
Can you please called patient and schedule follow up with Dr. Lorin PicketScott. Thanks.

## 2015-08-29 NOTE — Telephone Encounter (Signed)
Pt was very appreciative for being accepted as a patient of Dr Lorin PicketScott.

## 2015-10-02 ENCOUNTER — Telehealth: Payer: Self-pay | Admitting: Internal Medicine

## 2015-10-02 ENCOUNTER — Ambulatory Visit (INDEPENDENT_AMBULATORY_CARE_PROVIDER_SITE_OTHER): Payer: Medicare Other

## 2015-10-02 ENCOUNTER — Ambulatory Visit (INDEPENDENT_AMBULATORY_CARE_PROVIDER_SITE_OTHER): Payer: Medicare Other | Admitting: Family Medicine

## 2015-10-02 ENCOUNTER — Encounter: Payer: Self-pay | Admitting: Family Medicine

## 2015-10-02 VITALS — BP 134/82 | HR 70 | Temp 98.3°F | Ht 62.0 in | Wt 140.0 lb

## 2015-10-02 DIAGNOSIS — M25562 Pain in left knee: Secondary | ICD-10-CM | POA: Insufficient documentation

## 2015-10-02 NOTE — Telephone Encounter (Signed)
Scheduled with Dr. Adriana Simas at 115pm. thanks

## 2015-10-02 NOTE — Telephone Encounter (Signed)
Given the fall and swelling, I would recommend for her to be evaluated.  Needs to come in for evaluation.  (she apparently is going to establish care with me in September).  If someone unable to see her here, then acute care for evaluation.

## 2015-10-02 NOTE — Telephone Encounter (Signed)
Pt called she had fallen outside on both knees and hands. Pt had broken her left knee before and it is swollen really bad. No appt available. Only acutes. Please advise?   Call pt @ [phone]. Thank you!

## 2015-10-02 NOTE — Telephone Encounter (Signed)
Patient fell this am walking with the dog, another individual was walking towards her so she went to get off the sidewalk, fell over her dog and hit the pavement, Both knees hit the pavement and hands tried to break her fall.  Chin did hit also but no damage to it.  Left knee swollen pretty bad, (Patient was concerned as she fractured this knee prior) not able to put pressure on it, has a knot on the knee, icing and elevating currently. Right knee swollen a bit, not as bothersome and has open scratch's on it.  Hands scratched up, not a problem for her.  Didn't hit head. Please advise?

## 2015-10-02 NOTE — Progress Notes (Signed)
Pre visit review using our clinic review tool, if applicable. No additional management support is needed unless otherwise documented below in the visit note. 

## 2015-10-02 NOTE — Progress Notes (Signed)
Subjective:  Patient ID: Morgan Gallagher, female    DOB: 30-Nov-1950  Age: 65 y.o. MRN: [id number]  CC: Fall  HPI:  65 year old female with osteoporosis presents for evaluation after suffering a fall.  Patient reports that she fell earlier this morning. Patient states that she was living in elderly woman by and step down off the sidewalk onto the pavement. She subsequently tripped and fell landing directly on her knees with outstretched hands. Patient suffered abrasions on her hands as well as the right knee. She states that she's having pain in both knees but is particularly concerned about the left knee. Left knee has some swelling and bruising. She's having difficulty bearing weight fully on that leg. She's taken some Aleve as well as iced and elevated the area. Pain is moderate and exacerbated by activity. Her primary concern is fracture as she has osteoporosis and has a history of fracture of the left knee. No other complaints at this time.  Social Hx   Social History   Social History  . Marital status: Single    Spouse name: N/A  . Number of children: 2  . Years of education: 13   Occupational History  . Teacher Retail buyer   Social History Main Topics  . Smoking status: Never Smoker  . Smokeless tobacco: Never Used  . Alcohol use No  . Drug use: No  . Sexual activity: Not Asked   Other Topics Concern  . None   Social History Narrative   Morgan Gallagher grew up in [place]. She is divorced as has 2 daughter Morgan Gallagher, [name]). She lives in [place]. Morgan Gallagher is living with her 2/2 illness (possible ALS). She works as a Research officer, trade [employer].       Hobbies: stainglass         Review of Systems  Musculoskeletal: Positive for joint swelling.       Left knee pain/swelling.  Skin:       Abrasions - right knee, palms.   Objective:  BP 134/82   Pulse 70   Temp 98.3 F (36.8 C) (Oral)   Ht  (1.575 m)   Wt 140 lb (63.5 kg)   SpO2 98%   BMI 25.61  kg/m   BP/Weight 10/02/2015 11/21/2014 11/18/2013  Systolic BP 134 120 122  Diastolic BP 82 78 83  Wt. (Lbs) 140 138.8 134.75  BMI 25.61 25.38 24.64   Physical Exam  Constitutional: She is oriented to person, place, and time. She appears well-developed. No distress.  Pulmonary/Chest: Effort normal.  Musculoskeletal:  Knees, bilateral: Mild swelling of the left knee. Right knee appears normal excluding the abrasion. Ligaments intact bilaterally. Bruising of left knee noted. Medial joint line tenderness to palpation as this is the area of swelling and bruising.  Neurological: She is alert and oriented to person, place, and time.  Skin:  Abrasions noted of the right knee and palms of the hands.  Psychiatric: She has a normal mood and affect.  Vitals reviewed.  Lab Results  Component Value Date   WBC 8.1 11/21/2014   HGB 14.6 11/21/2014   HCT 44.2 11/21/2014   PLT 231.0 11/21/2014   GLUCOSE 97 11/21/2014   CHOL 182 11/21/2014   TRIG 135.0 11/21/2014   HDL 54.70 11/21/2014   LDLCALC 100 (H) 11/21/2014   ALT 20 11/21/2014   AST 23 11/21/2014   NA 142 11/21/2014   K 4.8 11/21/2014   CL 107 11/21/2014   CREATININE 0.72 11/21/2014  BUN 17 11/21/2014   CO2 28 11/21/2014   TSH 0.96 11/18/2013   HGBA1C 5.7 11/21/2014   Assessment & Plan:   Problem List Items Addressed This Visit    Left knee pain - Primary    New Problem. Patient with a fall earlier this morning. Patient with mild bruising and swelling of the left knee. Given history of osteoporosis and patient concern, obtaining x-ray for further evaluation. Advised over-the-counter Tylenol, Aleve (or motrin) for pain. RICE therapy as well.       Relevant Orders   DG Knee Complete 4 Views Left    Other Visit Diagnoses   None.    Follow-up: PRN  Everlene Other DO [facility]

## 2015-10-02 NOTE — Patient Instructions (Signed)
Rest, ice, elevation.  Tylenol and Aleve (or Ibuprofen) is fine for pain/swelling.  We will call with your xray results.  Take care  Dr. Adriana Simas

## 2015-10-02 NOTE — Assessment & Plan Note (Signed)
New Problem. Patient with a fall earlier this morning. Patient with mild bruising and swelling of the left knee. Given history of osteoporosis and patient concern, obtaining x-ray for further evaluation. Advised over-the-counter Tylenol, Aleve (or motrin) for pain. RICE therapy as well.

## 2015-11-23 ENCOUNTER — Other Ambulatory Visit: Payer: Self-pay | Admitting: Nurse Practitioner

## 2015-11-24 ENCOUNTER — Telehealth: Payer: Self-pay | Admitting: Internal Medicine

## 2015-11-24 MED ORDER — ATORVASTATIN CALCIUM 10 MG PO TABS: 10.0000 mg | ORAL_TABLET | Freq: Every day | ORAL | 11 refills | Status: DC

## 2015-11-24 NOTE — Telephone Encounter (Signed)
Rx Sent  

## 2015-11-24 NOTE — Telephone Encounter (Signed)
Pt needs a refill on her atorvastatin (LIPITOR) 10 MG tablet. She took her last pill today. She has an appt coming up on the 27th.

## 2015-11-29 ENCOUNTER — Encounter: Payer: Self-pay | Admitting: Internal Medicine

## 2015-11-29 ENCOUNTER — Ambulatory Visit (INDEPENDENT_AMBULATORY_CARE_PROVIDER_SITE_OTHER): Payer: Medicare Other | Admitting: Internal Medicine

## 2015-11-29 VITALS — BP 128/82 | HR 95 | Temp 97.6°F | Ht 62.0 in | Wt 137.8 lb

## 2015-11-29 DIAGNOSIS — Z23 Encounter for immunization: Secondary | ICD-10-CM | POA: Diagnosis not present

## 2015-11-29 DIAGNOSIS — K219 Gastro-esophageal reflux disease without esophagitis: Secondary | ICD-10-CM

## 2015-11-29 DIAGNOSIS — Z0000 Encounter for general adult medical examination without abnormal findings: Secondary | ICD-10-CM | POA: Diagnosis not present

## 2015-11-29 DIAGNOSIS — M8580 Other specified disorders of bone density and structure, unspecified site: Secondary | ICD-10-CM | POA: Diagnosis not present

## 2015-11-29 DIAGNOSIS — E7800 Pure hypercholesterolemia, unspecified: Secondary | ICD-10-CM

## 2015-11-29 DIAGNOSIS — E2839 Other primary ovarian failure: Secondary | ICD-10-CM

## 2015-11-29 DIAGNOSIS — Z1239 Encounter for other screening for malignant neoplasm of breast: Secondary | ICD-10-CM | POA: Diagnosis not present

## 2015-11-29 LAB — CBC WITH DIFFERENTIAL/PLATELET
BASOS ABS: 0.1 10*3/uL (ref 0.0–0.1)
Basophils Relative: 0.8 % (ref 0.0–3.0)
EOS ABS: 0.3 10*3/uL (ref 0.0–0.7)
Eosinophils Relative: 4.5 % (ref 0.0–5.0)
HCT: 43.4 % (ref 36.0–46.0)
Hemoglobin: 14.7 g/dL (ref 12.0–15.0)
LYMPHS ABS: 1.7 10*3/uL (ref 0.7–4.0)
Lymphocytes Relative: 26.2 % (ref 12.0–46.0)
MCHC: 33.8 g/dL (ref 30.0–36.0)
MCV: 90.5 fl (ref 78.0–100.0)
MONO ABS: 0.8 10*3/uL (ref 0.1–1.0)
Monocytes Relative: 11.7 % (ref 3.0–12.0)
NEUTROS ABS: 3.7 10*3/uL (ref 1.4–7.7)
NEUTROS PCT: 56.8 % (ref 43.0–77.0)
PLATELETS: 259 10*3/uL (ref 150.0–400.0)
RBC: 4.8 Mil/uL (ref 3.87–5.11)
RDW: 13.5 % (ref 11.5–15.5)
WBC: 6.6 10*3/uL (ref 4.0–10.5)

## 2015-11-29 LAB — LIPID PANEL
CHOL/HDL RATIO: 4
CHOLESTEROL: 188 mg/dL (ref 0–200)
HDL: 51.4 mg/dL (ref >39.00)
LDL CALC: 104 mg/dL — AB (ref 0–99)
NonHDL: 136.41
TRIGLYCERIDES: 160 mg/dL — AB (ref 0.0–149.0)
VLDL: 32 mg/dL (ref 0.0–40.0)

## 2015-11-29 LAB — COMPREHENSIVE METABOLIC PANEL
ALT: 22 U/L (ref 0–35)
AST: 22 U/L (ref 0–37)
Albumin: 4.3 g/dL (ref 3.5–5.2)
Alkaline Phosphatase: 85 U/L (ref 39–117)
BILIRUBIN TOTAL: 0.4 mg/dL (ref 0.2–1.2)
BUN: 13 mg/dL (ref 6–23)
CO2: 31 meq/L (ref 19–32)
Calcium: 9.3 mg/dL (ref 8.4–10.5)
Chloride: 105 mEq/L (ref 96–112)
Creatinine, Ser: 0.69 mg/dL (ref 0.40–1.20)
GFR: 90.22 mL/min (ref >60.00)
GLUCOSE: 93 mg/dL (ref 70–99)
Potassium: 4.3 mEq/L (ref 3.5–5.1)
SODIUM: 142 meq/L (ref 135–145)
TOTAL PROTEIN: 7.6 g/dL (ref 6.0–8.3)

## 2015-11-29 LAB — VITAMIN D 25 HYDROXY (VIT D DEFICIENCY, FRACTURES): VITD: 46.29 ng/mL (ref 30.00–100.00)

## 2015-11-29 LAB — TSH: TSH: 1.6 u[IU]/mL (ref 0.35–4.50)

## 2015-11-29 NOTE — Progress Notes (Signed)
Pre visit review using our clinic review tool, if applicable. No additional management support is needed unless otherwise documented below in the visit note. 

## 2015-11-29 NOTE — Progress Notes (Signed)
Patient ID: Morgan PeronePhyllis A Jago, female   DOB: 15-Aug-1950, 65 y.o.   MRN: [id number]   Subjective:    Patient ID: Morgan Gallagher, female    DOB: 15-Aug-1950, 65 y.o.   MRN: [id number]  HPI  Patient here for her physical exam.  Previous pt of Raquel Rey.  Here to establish care.  She fell 10/02/15.  Doing ok.  Tries to stay active.  No chest pain.  No sob.  No acid reflux reported.  No abdominal pain or cramping.  Bowels stable.  Is s/p hysterectomy.  Has a history of endometriosis.  Had colonoscopy at age 65.  Saw Dr Markham JordanElliot.  States had negative cologuard.  Previously on fosamax.      Past Medical History:  Diagnosis Date  . History of chicken pox   . History of diverticulitis   . Hyperlipidemia   . Osteoporosis   . Ulcer [facility](HCC)    Past Surgical History:  Procedure Laterality Date  . APPENDECTOMY  2005  . TOTAL ABDOMINAL HYSTERECTOMY W/ BILATERAL SALPINGOOPHORECTOMY  1982   Family History  Problem Relation Age of Onset  . Hyperlipidemia Mother   . Diabetes Mother   . Stroke Mother   . Hyperlipidemia Father   . Heart disease Father 942    died  . Alcohol abuse Father   . Cancer Maternal Aunt     ovarian  . Hyperlipidemia Maternal Grandmother   . Early death Brother 3    Run over by his mother  . Hypertension Paternal Aunt    Social History   Social History  . Marital status: Single    Spouse name: N/A  . Number of children: 2  . Years of education: 13   Occupational History  . Teacher Retail buyerAssistant     [employer]   Social History Main Topics  . Smoking status: Never Smoker  . Smokeless tobacco: Never Used  . Alcohol use No  . Drug use: No  . Sexual activity: Not Asked   Other Topics Concern  . None   Social History Narrative   Jamesetta Sohyllis grew up in [place]. She is divorced as has 2 daughter [name], [place]). She lives in [place]. Rodney Boozeasha is living with her 2/2 illness (possible ALS). She works as a Research officer, trade unionteachers assistant.       Hobbies: stainglass           Outpatient Encounter Prescriptions as of 11/29/2015  Medication Sig  . Ascorbic Acid (VITAMIN C) 1000 MG tablet Take 1,000 mg by mouth daily.  Marland Kitchen. atorvastatin (LIPITOR) 10 MG tablet Take 1 tablet (10 mg total) by mouth daily.  . Calcium Carbonate-Vit D-Min (CALTRATE 600+D PLUS PO) Take 1 tablet by mouth 2 (two) times daily.  . Flaxseed, Linseed, (FLAXSEED OIL) 1000 MG CAPS Take 1 capsule by mouth daily.  . Multiple Vitamins-Minerals (CENTRUM SILVER ADULT 50+ PO) Take 1 tablet by mouth daily.  . Omega-3 Fatty Acids (FISH OIL) 1200 MG CAPS Take 1 capsule by mouth daily.  . psyllium (METAMUCIL) 58.6 % powder Take 1 packet by mouth daily.  . vitamin E 400 UNIT capsule Take 400 Units by mouth daily.   No facility-administered encounter medications on file as of 11/29/2015.     Review of Systems  Constitutional: Negative for appetite change and unexpected weight change.  HENT: Negative for congestion, sinus pressure and sore throat.   Eyes: Negative for pain and visual disturbance.  Respiratory: Negative for cough, chest tightness and shortness of breath.   Cardiovascular:  Negative for chest pain, palpitations and leg swelling.  Gastrointestinal: Negative for abdominal pain, diarrhea, nausea and vomiting.  Genitourinary: Negative for difficulty urinating and dysuria.  Musculoskeletal: Negative for back pain and joint swelling.  Skin: Negative for color change and rash.  Neurological: Negative for dizziness, light-headedness and headaches.  Hematological: Negative for adenopathy. Does not bruise/bleed easily.  Psychiatric/Behavioral: Negative for agitation and dysphoric mood.       Objective:     Blood pressure rechecked by me:  130/82-84  Physical Exam  Constitutional: She is oriented to person, place, and time. She appears well-developed and well-nourished. No distress.  HENT:  Nose: Nose normal.  Mouth/Throat: Oropharynx is clear and moist.  Eyes: Right eye exhibits no  discharge. Left eye exhibits no discharge. No scleral icterus.  Neck: Neck supple. No thyromegaly present.  Cardiovascular: Normal rate and regular rhythm.   Pulmonary/Chest: Breath sounds normal. No accessory muscle usage. No tachypnea. No respiratory distress. She has no decreased breath sounds. She has no wheezes. She has no rhonchi. Right breast exhibits no inverted nipple, no mass, no nipple discharge and no tenderness (no axillary adenopathy). Left breast exhibits no inverted nipple, no mass, no nipple discharge and no tenderness (no axilarry adenopathy).  Abdominal: Soft. Bowel sounds are normal. There is no tenderness.  Musculoskeletal: She exhibits no edema or tenderness.  Lymphadenopathy:    She has no cervical adenopathy.  Neurological: She is alert and oriented to person, place, and time.  Skin: Skin is warm. No rash noted. No erythema.  Psychiatric: She has a normal mood and affect. Her behavior is normal.    BP 128/82   Pulse 95   Temp 97.6 F (36.4 C) (Oral)   Ht 5\' 2"  (1.575 m)   Wt 137 lb 12.8 oz (62.5 kg)   SpO2 96%   BMI 25.20 kg/m  Wt Readings from Last 3 Encounters:  11/29/15 137 lb 12.8 oz (62.5 kg)  10/02/15 140 lb (63.5 kg)  11/21/14 138 lb 12.8 oz (63 kg)     Lab Results  Component Value Date   WBC 6.6 11/29/2015   HGB 14.7 11/29/2015   HCT 43.4 11/29/2015   PLT 259.0 11/29/2015   GLUCOSE 93 11/29/2015   CHOL 188 11/29/2015   TRIG 160.0 (H) 11/29/2015   HDL 51.40 11/29/2015   LDLCALC 104 (H) 11/29/2015   ALT 22 11/29/2015   AST 22 11/29/2015   NA 142 11/29/2015   K 4.3 11/29/2015   CL 105 11/29/2015   CREATININE 0.69 11/29/2015   BUN 13 11/29/2015   CO2 31 11/29/2015   TSH 1.60 11/29/2015   HGBA1C 5.7 11/21/2014    Mm Digital Screening  Result Date: 01/16/2015 CLINICAL DATA:  Screening. EXAM: DIGITAL SCREENING BILATERAL MAMMOGRAM WITH CAD COMPARISON:  Previous exam(s). ACR Breast Density Category b: There are scattered areas of  fibroglandular density. FINDINGS: There are no findings suspicious for malignancy. Images were processed with CAD. IMPRESSION: No mammographic evidence of malignancy. A result letter of this screening mammogram will be mailed directly to the patient. RECOMMENDATION: Screening mammogram in one year. (Code:SM-B-01Y) BI-RADS CATEGORY  1: Negative. Electronically Signed   By: Frederico Hamman M.D.   On: 01/16/2015 16:18       Assessment & Plan:   Problem List Items Addressed This Visit    GERD (gastroesophageal reflux disease)    No acid reflux symptoms reported today.  Follow.        Healthcare maintenance    Physical today  11/29/15.  Mammogram 01/16/15 - Birads I.  Schedule f/u mammogram.  S/p hysterectomy.  Has seen Dr Feliberto Gottron.  Colonoscopy - age 58.  States cologuard negative - Elliot.  Need records.       Hypercholesteremia    On lipitor.  Low cholesterol diet and exercise.  Follow lipid panel and liver function tests.        Relevant Orders   CBC with Differential/Platelet (Completed)   Comprehensive metabolic panel (Completed)   TSH (Completed)   Lipid panel (Completed)   Osteopenia    States previously on fosamax.  Follow. Weight bearing exercise.  Schedule f/u bone density.  Check vitamin D level.        Relevant Orders   DG Bone Density   VITAMIN D 25 Hydroxy (Vit-D Deficiency, Fractures) (Completed)    Other Visit Diagnoses    Breast cancer screening    -  Primary   Relevant Orders   MM DIGITAL SCREENING BILATERAL   Estrogen deficiency       Relevant Orders   DG Bone Density   Encounter for immunization       Relevant Orders   Flu vaccine HIGH DOSE PF (Completed)       Dale [place], MD

## 2015-11-30 ENCOUNTER — Encounter: Payer: Self-pay | Admitting: Internal Medicine

## 2015-12-10 ENCOUNTER — Encounter: Payer: Self-pay | Admitting: Internal Medicine

## 2015-12-10 NOTE — Assessment & Plan Note (Signed)
Physical today 11/29/15.  Mammogram 01/16/15 - Birads I.  Schedule f/u mammogram.  S/p hysterectomy.  Has seen Dr Feliberto GottronSchermerhorn.  Colonoscopy - age 65.  States cologuard negative - Elliot.  Need records.

## 2015-12-10 NOTE — Assessment & Plan Note (Addendum)
States previously on fosamax.  Follow. Weight bearing exercise.  Schedule f/u bone density.  Check vitamin D level.

## 2015-12-10 NOTE — Assessment & Plan Note (Signed)
No acid reflux symptoms reported today.  Follow.

## 2015-12-10 NOTE — Assessment & Plan Note (Signed)
On lipitor.  Low cholesterol diet and exercise.  Follow lipid panel and liver function tests.   

## 2015-12-12 ENCOUNTER — Ambulatory Visit: Payer: Medicare Other

## 2015-12-12 ENCOUNTER — Telehealth: Payer: Self-pay | Admitting: Internal Medicine

## 2015-12-12 NOTE — Telephone Encounter (Signed)
FYI - Pt stated that she had her shingles vaccine about 2-3 years ago. She just wants it noted in her mychart.

## 2016-01-18 ENCOUNTER — Other Ambulatory Visit: Payer: Self-pay | Admitting: Internal Medicine

## 2016-01-18 ENCOUNTER — Ambulatory Visit
Admission: RE | Admit: 2016-01-18 | Discharge: 2016-01-18 | Disposition: A | Discharge status: Home / Self Care | Payer: Medicare Other | Source: Ambulatory Visit | Attending: Internal Medicine | Admitting: Internal Medicine

## 2016-01-18 DIAGNOSIS — Z1231 Encounter for screening mammogram for malignant neoplasm of breast: Secondary | ICD-10-CM | POA: Diagnosis present

## 2016-01-18 DIAGNOSIS — M8580 Other specified disorders of bone density and structure, unspecified site: Secondary | ICD-10-CM | POA: Diagnosis not present

## 2016-01-18 DIAGNOSIS — Z13820 Encounter for screening for osteoporosis: Secondary | ICD-10-CM | POA: Insufficient documentation

## 2016-01-18 DIAGNOSIS — Z1239 Encounter for other screening for malignant neoplasm of breast: Secondary | ICD-10-CM

## 2016-01-18 DIAGNOSIS — E2839 Other primary ovarian failure: Secondary | ICD-10-CM

## 2016-01-21 ENCOUNTER — Encounter: Payer: Self-pay | Admitting: Internal Medicine

## 2016-07-04 ENCOUNTER — Ambulatory Visit (INDEPENDENT_AMBULATORY_CARE_PROVIDER_SITE_OTHER): Payer: Medicare Other

## 2016-07-04 VITALS — BP 126/80 | HR 71 | Temp 98.2°F | Resp 12 | Ht 62.0 in | Wt 136.0 lb

## 2016-07-04 DIAGNOSIS — Z0000 Encounter for general adult medical examination without abnormal findings: Secondary | ICD-10-CM | POA: Diagnosis not present

## 2016-07-04 NOTE — Patient Instructions (Addendum)
  Morgan Gallagher , Thank you for taking time to come for your Medicare Wellness Visit. I appreciate your ongoing commitment to your health goals. Please review the following plan we discussed and let me know if I can assist you in the future.   Follow up with Dr. Lorin PicketScott as needed.    Bring a copy of your Health Care Power of Attorney and/or Living Will to be scanned into chart once completed.  Have a great day!  These are the goals we discussed: Goals    . Increase physical activity          Stay active and walk for exercise every other day, 30 minutes minimum        This is a list of the screening recommended for you and due dates:  Health Maintenance  Topic Date Due  .  Hepatitis C: One time screening is recommended by Center for Disease Control  (CDC) for  adults born from [birth year] through 1967.   1950-12-10  . Colon Cancer Screening  03/04/2013  . Flu Shot  10/02/2016  . Pneumonia vaccines (2 of 2 - PPSV23) 12/11/2016  . Mammogram  01/17/2018  . Tetanus Vaccine  11/19/2023  . DEXA scan (bone density measurement)  Completed

## 2016-07-04 NOTE — Progress Notes (Signed)
Subjective:   Morgan Gallagher is a 66 y.o. female who presents for an Initial Medicare Annual Wellness Visit.  Review of Systems    No ROS.  Medicare Wellness Visit.  Cardiac Risk Factors include: advanced age (>64men, >1 women)     Objective:    Today's Vitals   07/04/16 0851  BP: 126/80  Pulse: 71  Resp: 12  Temp: 98.2 F (36.8 C)  TempSrc: Oral  SpO2: 97%  Weight: 136 lb (61.7 kg)  Height: 5\' 2"  (1.575 m)   Body mass index is 24.87 kg/m.   Current Medications (verified) Outpatient Encounter Prescriptions as of 07/04/2016  Medication Sig  . Ascorbic Acid (VITAMIN C) 1000 MG tablet Take 1,000 mg by mouth daily.  Marland Kitchen atorvastatin (LIPITOR) 10 MG tablet Take 1 tablet (10 mg total) by mouth daily.  . Calcium Carbonate-Vit D-Min (CALTRATE 600+D PLUS PO) Take 1 tablet by mouth 2 (two) times daily.  . Flaxseed, Linseed, (FLAXSEED OIL) 1000 MG CAPS Take 1 capsule by mouth daily.  . Multiple Vitamins-Minerals (CENTRUM SILVER ADULT 50+ PO) Take 1 tablet by mouth daily.  . Omega-3 Fatty Acids (FISH OIL) 1200 MG CAPS Take 1 capsule by mouth daily.  . psyllium (METAMUCIL) 58.6 % powder Take 1 packet by mouth daily.  . vitamin E 400 UNIT capsule Take 400 Units by mouth daily.   No facility-administered encounter medications on file as of 07/04/2016.     Allergies (verified) Augmentin [amoxicillin-pot clavulanate] and Demerol [meperidine]   History: Past Medical History:  Diagnosis Date  . History of chicken pox   . History of diverticulitis   . Hyperlipidemia   . Osteoporosis   . Ulcer    Past Surgical History:  Procedure Laterality Date  . APPENDECTOMY  2005  . TOTAL ABDOMINAL HYSTERECTOMY W/ BILATERAL SALPINGOOPHORECTOMY  1982   Family History  Problem Relation Age of Onset  . Hyperlipidemia Mother   . Diabetes Mother   . Stroke Mother   . Hyperlipidemia Father   . Heart disease Father 77    died  . Alcohol abuse Father   . Cancer Maternal Aunt     ovarian    . Hyperlipidemia Maternal Grandmother   . Early death Brother 3    Run over by his mother  . Hypertension Paternal Aunt   . ALS Daughter    Social History   Occupational History  . Teacher Retail buyer   Social History Main Topics  . Smoking status: Never Smoker  . Smokeless tobacco: Never Used  . Alcohol use No  . Drug use: No  . Sexual activity: Not Currently    Tobacco Counseling Counseling given: Not Answered   Activities of Daily Living In your present state of health, do you have any difficulty performing the following activities: 07/04/2016  Hearing? N  Vision? N  Difficulty concentrating or making decisions? N  Walking or climbing stairs? Y  Dressing or bathing? N  Doing errands, shopping? N  Preparing Food and eating ? N  Using the Toilet? N  In the past six months, have you accidently leaked urine? N  Do you have problems with loss of bowel control? N  Managing your Medications? N  Managing your Finances? N  Housekeeping or managing your Housekeeping? N  Some recent data might be hidden    Immunizations and Health Maintenance Immunization History  Administered Date(s) Administered  . Influenza, High Dose Seasonal PF 11/29/2015  . Influenza,inj,Quad PF,36+  Mos 11/18/2013, 11/21/2014  . Pneumococcal Conjugate-13 12/12/2015  . Td 11/18/2013  . Zoster 03/04/2012   Health Maintenance Due  Topic Date Due  . Hepatitis C Screening  1950-08-12  . COLONOSCOPY  03/04/2013    Patient Care Team: Dale Woodlawn, MD as PCP - General (Internal Medicine)  Indicate any recent Medical Services you may have received from other than [facility] providers in the past year (date may be approximate).     Assessment:   This is a routine wellness examination for [place]. The goal of the wellness visit is to assist the patient how to close the gaps in care and create a preventative care plan for the patient.   Taking calcium VIT D as  appropriate/Osteoporosis reviewed.  Medications reviewed; taking without issues or barriers.  Safety issues reviewed; smoke detectors in the home. No firearms in the home.  Wears seatbelts when driving or riding with others. Patient does wear sunscreen or protective clothing when in direct sunlight. No violence in the home.  Depression- PHQ 2 &9 complete.  No signs/symptoms or verbal communication regarding little pleasure in doing things, feeling down, depressed or hopeless. No changes in sleeping, energy, eating, concentrating.  No thoughts of self harm or harm towards others.  Time spent on this topic is 9 minutes.  Patient is alert, normal appearance, oriented to person/place/and time. Correctly identified the president of the [place], recall of 3/3 words, and performing simple calculations.  Patient displays appropriate judgement and can read correct time from watch face.  No new identified risk were noted.  No failures at ADL's or IADL's.   BMI- discussed the importance of a healthy diet, water intake and exercise. Educational material provided.   Daily fluid intake: 0 cups of caffeine, 6 cups of water  Dental- every six months.  Sleep patterns- Sleeps 6-8 hours at night.  Wakes feeling rested.  Hepatitis C Screening discussed, educational material provided.  Colonoscopy/Cologuard discussed.    Patient Concerns: None at this time. Follow up with PCP as needed.  Hearing/Vision screen Hearing Screening Comments: Patient is able to hear conversational tones without difficulty.  No issues reported.   Vision Screening Comments: Followed by Dr. Larence Penning Wears corrective lenses when reading Last OV 09/2015 Visual acuity not assessed per patient preference since they have regular follow up with the ophthalmologist  Dietary issues and exercise activities discussed: Current Exercise Habits: Home exercise routine, Type of exercise: calisthenics (pilates), Time (Minutes): 40, Frequency  (Times/Week): 3, Weekly Exercise (Minutes/Week): 120, Intensity: Mild  Goals    . Increase physical activity          Stay active and walk for exercise every other day, 30 minutes minimum       Depression Screen PHQ 2/9 Scores 07/04/2016 11/29/2015  PHQ - 2 Score 0 0  PHQ- 9 Score 0 -    Fall Risk Fall Risk  07/04/2016 11/29/2015  Falls in the past year? No No    Cognitive Function: MMSE - Mini Mental State Exam 07/04/2016  Orientation to time 5  Orientation to Place 5  Registration 3  Attention/ Calculation 5  Recall 3  Language- name 2 objects 2  Language- repeat 1  Language- follow 3 step command 3  Language- read & follow direction 1  Write a sentence 1  Copy design 1  Total score 30        Screening Tests Health Maintenance  Topic Date Due  . Hepatitis C Screening  August 22, 1950  . COLONOSCOPY  03/04/2013  . INFLUENZA VACCINE  10/02/2016  . PNA vac Low Risk Adult (2 of 2 - PPSV23) 12/11/2016  . MAMMOGRAM  01/17/2018  . TETANUS/TDAP  11/19/2023  . DEXA SCAN  Completed      Plan:    End of life planning; Advanced aging; Advanced directives discussed.  No HCPOA/Living Will.  Additional information provided to help them start the conversation with family.  Copy of HCPOA/Living Will requested upon completion. Time spent on this topic is 25 minutes.  I have personally reviewed and noted the following in the patient's chart:   . Medical and social history . Use of alcohol, tobacco or illicit drugs  . Current medications and supplements . Functional ability and status . Nutritional status . Physical activity . Advanced directives . List of other physicians . Hospitalizations, surgeries, and ER visits in previous 12 months . Vitals . Screenings to include cognitive, depression, and falls . Referrals and appointments  In addition, I have reviewed and discussed with patient certain preventive protocols, quality metrics, and best practice recommendations. A written  personalized care plan for preventive services as well as general preventive health recommendations were provided to patient.     Ashok PallOBrien-Blaney, Keshana Klemz L, LPN   1/6/10965/05/2016   Reviewed above information.  Agree with plan.   Dr Lorin PicketScott

## 2016-07-14 ENCOUNTER — Emergency Department
Admission: EM | Admit: 2016-07-14 | Discharge: 2016-07-14 | Disposition: A | Discharge status: Home / Self Care | Payer: Medicare Other | Attending: Emergency Medicine | Admitting: Emergency Medicine

## 2016-07-14 ENCOUNTER — Encounter: Payer: Self-pay | Admitting: Emergency Medicine

## 2016-07-14 ENCOUNTER — Emergency Department: Payer: Medicare Other

## 2016-07-14 DIAGNOSIS — Y999 Unspecified external cause status: Secondary | ICD-10-CM | POA: Insufficient documentation

## 2016-07-14 DIAGNOSIS — Y939 Activity, unspecified: Secondary | ICD-10-CM | POA: Insufficient documentation

## 2016-07-14 DIAGNOSIS — S92255A Nondisplaced fracture of navicular [scaphoid] of left foot, initial encounter for closed fracture: Secondary | ICD-10-CM | POA: Insufficient documentation

## 2016-07-14 DIAGNOSIS — Y929 Unspecified place or not applicable: Secondary | ICD-10-CM | POA: Insufficient documentation

## 2016-07-14 DIAGNOSIS — W208XXA Other cause of strike by thrown, projected or falling object, initial encounter: Secondary | ICD-10-CM | POA: Diagnosis not present

## 2016-07-14 DIAGNOSIS — Z79899 Other long term (current) drug therapy: Secondary | ICD-10-CM | POA: Insufficient documentation

## 2016-07-14 DIAGNOSIS — S92256A Nondisplaced fracture of navicular [scaphoid] of unspecified foot, initial encounter for closed fracture: Secondary | ICD-10-CM

## 2016-07-14 DIAGNOSIS — S99922A Unspecified injury of left foot, initial encounter: Secondary | ICD-10-CM | POA: Diagnosis present

## 2016-07-14 MED ORDER — HYDROCODONE-ACETAMINOPHEN 5-325 MG PO TABS
1.0000 | ORAL_TABLET | Freq: Once | ORAL | Status: AC
  Administered 2016-07-14: 1 via ORAL
  Filled 2016-07-14: qty 1

## 2016-07-14 MED ORDER — HYDROCODONE-ACETAMINOPHEN 5-325 MG PO TABS: 1.0000 | ORAL_TABLET | ORAL | 0 refills | Status: DC | PRN

## 2016-07-14 NOTE — ED Provider Notes (Signed)
[facility] Emergency Department Provider Note  ____________________________________________   First MD Initiated Contact with Patient 07/14/16 [phone]     (approximate)  I have reviewed the triage vital signs and the nursing notes.   HISTORY  Chief Complaint Foot Injury    HPI Morgan Gallagher is a 66 y.o. female who comes to the emergency departmentwith severe aching left foot pain that began after dropping a piece of wood on her left foot shortly before arrival. Pain is worse when attempting to ambulate and improved with rest. Nonradiating. No numbness or weakness.   Past Medical History:  Diagnosis Date  . History of chicken pox   . History of diverticulitis   . Hyperlipidemia   . Osteoporosis   . Ulcer     Patient Active Problem List   Diagnosis Date Noted  . Osteopenia 11/29/2015  . Left knee pain 10/02/2015  . Healthcare maintenance 11/21/2014  . Adjustment disorder with mixed anxiety and depressed mood 11/21/2014  . GERD (gastroesophageal reflux disease) 11/21/2014  . Hypercholesteremia 09/27/2013  . OP (osteoporosis) 09/27/2013    Past Surgical History:  Procedure Laterality Date  . APPENDECTOMY  2005  . TOTAL ABDOMINAL HYSTERECTOMY W/ BILATERAL SALPINGOOPHORECTOMY  1982    Prior to Admission medications   Medication Sig Start Date End Date Taking? Authorizing Provider  Ascorbic Acid (VITAMIN C) 1000 MG tablet Take 1,000 mg by mouth daily.    [provider]  atorvastatin (LIPITOR) 10 MG tablet Take 1 tablet (10 mg total) by mouth daily. 11/24/15   Dale Coldwater, MD  Calcium Carbonate-Vit D-Min (CALTRATE 600+D PLUS PO) Take 1 tablet by mouth 2 (two) times daily.    [provider]  Flaxseed, Linseed, (FLAXSEED OIL) 1000 MG CAPS Take 1 capsule by mouth daily.    [provider]  HYDROcodone-acetaminophen (NORCO) 5-325 MG tablet Take 1 tablet by mouth every 4 (four) hours as needed for moderate pain.  07/14/16   Merrily Brittle, MD  Multiple Vitamins-Minerals (CENTRUM SILVER ADULT 50+ PO) Take 1 tablet by mouth daily.    [provider]  Omega-3 Fatty Acids (FISH OIL) 1200 MG CAPS Take 1 capsule by mouth daily.    [provider]  psyllium (METAMUCIL) 58.6 % powder Take 1 packet by mouth daily.    [provider]  vitamin E 400 UNIT capsule Take 400 Units by mouth daily.    [provider]    Allergies Augmentin [amoxicillin-pot clavulanate] and Demerol [meperidine]  Family History  Problem Relation Age of Onset  . Hyperlipidemia Mother   . Diabetes Mother   . Stroke Mother   . Hyperlipidemia Father   . Heart disease Father 34       died  . Alcohol abuse Father   . Cancer Maternal Aunt        ovarian  . Hyperlipidemia Maternal Grandmother   . Early death Brother 3       Run over by his mother  . Hypertension Paternal Aunt   . ALS Daughter     Social History Social History  Substance Use Topics  . Smoking status: Never Smoker  . Smokeless tobacco: Never Used  . Alcohol use No    Review of Systems Constitutional: No fever/chills Eyes: No visual changes. ENT: No sore throat. Cardiovascular: Denies chest pain. Respiratory: Denies shortness of breath. Gastrointestinal: No abdominal pain.  No nausea, no vomiting.  No diarrhea.  No constipation. Genitourinary: Negative for dysuria. Musculoskeletal: Negative for back  pain. Skin: Negative for rash. Neurological: Negative for headaches, focal weakness or numbness.  10-point ROS otherwise negative.  ____________________________________________   PHYSICAL EXAM:  VITAL SIGNS: ED Triage Vitals  Enc Vitals Group     BP 07/14/16 0247 (!) 163/90     Pulse Rate 07/14/16 0247 83     Resp 07/14/16 0247 16     Temp 07/14/16 0247 98.1 F (36.7 C)     Temp Source 07/14/16 0247 Oral     SpO2 07/14/16 0247 99 %     Weight --      Height --      Head Circumference --      Peak Flow --        Pain Score 07/14/16 0419 7     Pain Loc --      Pain Edu? --      Excl. in GC? --     Constitutional: Alert and oriented x 4 Appears quite uncomfortable Eyes: PERRL EOMI. Head: Atraumatic. Nose: No congestion/rhinnorhea. Mouth/Throat: No trismus Neck: No stridor.   Cardiovascular: Normal rate, regular rhythm. Grossly normal heart sounds.  Good peripheral circulation. Respiratory: Normal respiratory effort.  No retractions. Lungs CTAB and moving good air Gastrointestinal: Soft nontender Musculoskeletal: . Quite tender over her navicular bone on the left with swelling and tenderness over medial or lateral malleoli or for 6 cm proximal no tenderness over fifth metatarsal neurovascularly intact and compartments are soft  Neurologic:  Normal speech and language. No gross focal neurologic deficits are appreciated. Skin:  Skin is warm, dry and intact. No rash noted. Psychiatric: Mood and affect are normal. Speech and behavior are normal.    ____________________________________________    ____________________________________________   LABS (all labs ordered are listed, but only abnormal results are displayed)  Labs Reviewed - No data to display   __________________________________________  EKG   ____________________________________________  RADIOLOGY  X-ray shows a nondisplaced navicular fracture ____________________________________________   PROCEDURES  Procedure(s) performed:yes  SPLINT APPLICATION Date/Time: 2:41 AM Authorized by: Merrily Brittle Consent: Verbal consent obtained. Risks and benefits: risks, benefits and alternatives were discussed Consent given by: patient Splint applied by: orthopedic technician Location details: Left ankle  Splint type: Short leg posterior  Supplies used: Ortho-Glass  Post-procedure: The splinted body part was neurovascularly unchanged following the procedure. Patient tolerance: Patient tolerated the procedure well with  no immediate complications.     Procedures  Critical Care performed: no  Observation: no ____________________________________________   INITIAL IMPRESSION / ASSESSMENT AND PLAN / ED COURSE  Pertinent labs & imaging results that were available during my care of the patient were reviewed by me and considered in my medical decision making (see chart for details).  The time I saw the patient she had artery had an x-ray which shows a posttraumatic navicular fracture. She is neurovascularly intact in her compartments are soft. Placed in a short leg posterior splint and crutches will be given. Discharged home with or for follow-up within 1 week.      ____________________________________________   FINAL CLINICAL IMPRESSION(S) / ED DIAGNOSES  Final diagnoses:  Closed nondisplaced fracture of navicular bone of foot, unspecified laterality, initial encounter      NEW MEDICATIONS STARTED DURING THIS VISIT:  Discharge Medication List as of 07/14/2016  4:51 AM    START taking these medications   Details  HYDROcodone-acetaminophen (NORCO) 5-325 MG tablet Take 1 tablet by mouth every 4 (four) hours as needed for moderate pain., Starting Sun 07/14/2016, Print  Note:  This document was prepared using Dragon voice recognition software and may include unintentional dictation errors.     Merrily Brittleifenbark, Shauntay Brunelli, MD 07/15/16 [phone]

## 2016-07-14 NOTE — Discharge Instructions (Signed)
Please do not put any weight on her left foot until you are cleared by an orthopedic surgeon. Call on Monday to make an appointment for this coming week. Return to the emergency department for any concerns.  It was a pleasure to take care of you today, and thank you for coming to our emergency department.  If you have any questions or concerns before leaving please ask the nurse to grab me and I'm more than happy to go through your aftercare instructions again.  If you were prescribed any opioid pain medication today such as Norco, Vicodin, Percocet, morphine, hydrocodone, or oxycodone please make sure you do not drive when you are taking this medication as it can alter your ability to drive safely.  If you have any concerns once you are home that you are not improving or are in fact getting worse before you can make it to your follow-up appointment, please do not hesitate to call 911 and come back for further evaluation.  Merrily Brittle MD  Results for orders placed or performed in visit on 11/29/15  CBC with Differential/Platelet  Result Value Ref Range   WBC 6.6 4.0 - 10.5 K/uL   RBC 4.80 3.87 - 5.11 Mil/uL   Hemoglobin 14.7 12.0 - 15.0 g/dL   HCT 16.1 09.6 - 04.5 %   MCV 90.5 78.0 - 100.0 fl   MCHC 33.8 30.0 - 36.0 g/dL   RDW 40.9 81.1 - 91.4 %   Platelets 259.0 150.0 - 400.0 K/uL   Neutrophils Relative % 56.8 43.0 - 77.0 %   Lymphocytes Relative 26.2 12.0 - 46.0 %   Monocytes Relative 11.7 3.0 - 12.0 %   Eosinophils Relative 4.5 0.0 - 5.0 %   Basophils Relative 0.8 0.0 - 3.0 %   Neutro Abs 3.7 1.4 - 7.7 K/uL   Lymphs Abs 1.7 0.7 - 4.0 K/uL   Monocytes Absolute 0.8 0.1 - 1.0 K/uL   Eosinophils Absolute 0.3 0.0 - 0.7 K/uL   Basophils Absolute 0.1 0.0 - 0.1 K/uL  Comprehensive metabolic panel  Result Value Ref Range   Sodium 142 135 - 145 mEq/L   Potassium 4.3 3.5 - 5.1 mEq/L   Chloride 105 96 - 112 mEq/L   CO2 31 19 - 32 mEq/L   Glucose, Bld 93 70 - 99 mg/dL   BUN 13 6 - 23  mg/dL   Creatinine, Ser 7.82 0.40 - 1.20 mg/dL   Total Bilirubin 0.4 0.2 - 1.2 mg/dL   Alkaline Phosphatase 85 39 - 117 U/L   AST 22 0 - 37 U/L   ALT 22 0 - 35 U/L   Total Protein 7.6 6.0 - 8.3 g/dL   Albumin 4.3 3.5 - 5.2 g/dL   Calcium 9.3 8.4 - 95.6 mg/dL   GFR 21.30 >86.57 mL/min  TSH  Result Value Ref Range   TSH 1.60 0.35 - 4.50 uIU/mL  Lipid panel  Result Value Ref Range   Cholesterol 188 0 - 200 mg/dL   Triglycerides 846.9 (H) 0.0 - 149.0 mg/dL   HDL 62.95 >28.41 mg/dL   VLDL 32.4 0.0 - 40.1 mg/dL   LDL Cholesterol 027 (H) 0 - 99 mg/dL   Total CHOL/HDL Ratio 4    NonHDL 136.41   VITAMIN D 25 Hydroxy (Vit-D Deficiency, Fractures)  Result Value Ref Range   VITD 46.29 30.00 - 100.00 ng/mL   Dg Foot Complete Left  Result Date: 07/14/2016 CLINICAL DATA:  66 y/o F; injury to the foot with  bruising on the top of the left foot. EXAM: LEFT FOOT - COMPLETE 3+ VIEW COMPARISON:  None. FINDINGS: There is lucency traversing the superior aspect of the navicular bone at the talonavicular joint which may represent a nondisplaced fracture. There is soft tissue swelling over the dorsum of the foot. Small plantar calcaneal enthesophyte. No other fracture or dislocation is identified. IMPRESSION: There is lucency traversing the superior aspect of the navicular bone at the talonavicular joint best seen on the lateral view which may represent a acute nondisplaced fracture. Electronically Signed   By: Mitzi HansenLance  Furusawa-Stratton M.D.   On: 07/14/2016 03:34

## 2016-07-14 NOTE — ED Triage Notes (Signed)
Pt to triage in wheelchair due to left foor injury. Pt reports having a piece of wood hit her in the left foot on Saturday PM. Pt to ED due to swelling and pain in area. Pulses felt, no deformity noted in triage.

## 2016-07-14 NOTE — ED Notes (Signed)
Pt declines offer for pain medication or ice. Pt updated on delay, pt verbalizes understanding.

## 2016-10-08 LAB — FECAL OCCULT BLOOD, GUAIAC: FECAL OCCULT BLD: NEGATIVE

## 2016-10-19 ENCOUNTER — Other Ambulatory Visit: Payer: Self-pay | Admitting: Internal Medicine

## 2016-10-21 NOTE — Telephone Encounter (Signed)
No OV since 7/17 or labs ok to fill patient is scheduled for 10/1/ 18?

## 2016-10-22 ENCOUNTER — Other Ambulatory Visit: Payer: Self-pay | Admitting: Internal Medicine

## 2016-10-22 DIAGNOSIS — M8580 Other specified disorders of bone density and structure, unspecified site: Secondary | ICD-10-CM

## 2016-10-22 DIAGNOSIS — R739 Hyperglycemia, unspecified: Secondary | ICD-10-CM

## 2016-10-22 DIAGNOSIS — E7800 Pure hypercholesterolemia, unspecified: Secondary | ICD-10-CM

## 2016-10-22 NOTE — Telephone Encounter (Signed)
Please schedule pt for fasting lab appt and then refill until her appt.  Thanks

## 2016-10-22 NOTE — Progress Notes (Signed)
Order placed for upcoming labs.  

## 2016-10-24 NOTE — Telephone Encounter (Signed)
Pt was scheduled for her labs and pt needs a refill for atorvastatin (LIPITOR) 10 MG tablet.   Pharmacy is [facility] [address], [place] - [address]  Call pt @ [phone]. Thank you!

## 2016-10-25 NOTE — Telephone Encounter (Signed)
error 

## 2016-10-28 ENCOUNTER — Other Ambulatory Visit (INDEPENDENT_AMBULATORY_CARE_PROVIDER_SITE_OTHER): Payer: Medicare Other

## 2016-10-28 DIAGNOSIS — M8580 Other specified disorders of bone density and structure, unspecified site: Secondary | ICD-10-CM | POA: Diagnosis not present

## 2016-10-28 DIAGNOSIS — E7800 Pure hypercholesterolemia, unspecified: Secondary | ICD-10-CM

## 2016-10-28 DIAGNOSIS — R739 Hyperglycemia, unspecified: Secondary | ICD-10-CM | POA: Diagnosis not present

## 2016-10-28 LAB — BASIC METABOLIC PANEL
BUN: 15 mg/dL (ref 6–23)
CO2: 31 mEq/L (ref 19–32)
Calcium: 9.5 mg/dL (ref 8.4–10.5)
Chloride: 107 mEq/L (ref 96–112)
Creatinine, Ser: 0.66 mg/dL (ref 0.40–1.20)
GFR: 94.71 mL/min (ref >60.00)
Glucose, Bld: 100 mg/dL — ABNORMAL HIGH (ref 70–99)
POTASSIUM: 4.4 meq/L (ref 3.5–5.1)
SODIUM: 141 meq/L (ref 135–145)

## 2016-10-28 LAB — HEPATIC FUNCTION PANEL
ALK PHOS: 71 U/L (ref 39–117)
ALT: 26 U/L (ref 0–35)
AST: 31 U/L (ref 0–37)
Albumin: 4.5 g/dL (ref 3.5–5.2)
BILIRUBIN TOTAL: 0.5 mg/dL (ref 0.2–1.2)
Bilirubin, Direct: 0.1 mg/dL (ref 0.0–0.3)
Total Protein: 7.1 g/dL (ref 6.0–8.3)

## 2016-10-28 LAB — CBC WITH DIFFERENTIAL/PLATELET
BASOS ABS: 0.1 10*3/uL (ref 0.0–0.1)
Basophils Relative: 1.9 % (ref 0.0–3.0)
EOS ABS: 0.4 10*3/uL (ref 0.0–0.7)
Eosinophils Relative: 6.6 % — ABNORMAL HIGH (ref 0.0–5.0)
HCT: 42 % (ref 36.0–46.0)
Hemoglobin: 13.9 g/dL (ref 12.0–15.0)
LYMPHS ABS: 1.8 10*3/uL (ref 0.7–4.0)
Lymphocytes Relative: 30.4 % (ref 12.0–46.0)
MCHC: 33.1 g/dL (ref 30.0–36.0)
MCV: 93.8 fl (ref 78.0–100.0)
MONO ABS: 0.8 10*3/uL (ref 0.1–1.0)
Monocytes Relative: 13.6 % — ABNORMAL HIGH (ref 3.0–12.0)
NEUTROS ABS: 2.8 10*3/uL (ref 1.4–7.7)
NEUTROS PCT: 47.5 % (ref 43.0–77.0)
PLATELETS: 222 10*3/uL (ref 150.0–400.0)
RBC: 4.47 Mil/uL (ref 3.87–5.11)
RDW: 13.6 % (ref 11.5–15.5)
WBC: 5.9 10*3/uL (ref 4.0–10.5)

## 2016-10-28 LAB — VITAMIN D 25 HYDROXY (VIT D DEFICIENCY, FRACTURES): VITD: 58.56 ng/mL (ref 30.00–100.00)

## 2016-10-28 LAB — LIPID PANEL
Cholesterol: 143 mg/dL (ref 0–200)
HDL: 50 mg/dL (ref >39.00)
LDL CALC: 74 mg/dL (ref 0–99)
NONHDL: 93.45
Total CHOL/HDL Ratio: 3
Triglycerides: 95 mg/dL (ref 0.0–149.0)
VLDL: 19 mg/dL (ref 0.0–40.0)

## 2016-10-28 LAB — HEMOGLOBIN A1C: HEMOGLOBIN A1C: 5.7 % (ref 4.6–6.5)

## 2016-10-28 LAB — TSH: TSH: 1.25 u[IU]/mL (ref 0.35–4.50)

## 2016-10-29 ENCOUNTER — Encounter: Payer: Self-pay | Admitting: Internal Medicine

## 2016-10-31 NOTE — Telephone Encounter (Signed)
Hard copy mailed  

## 2016-12-02 ENCOUNTER — Encounter: Payer: Self-pay | Admitting: Internal Medicine

## 2016-12-02 ENCOUNTER — Ambulatory Visit (INDEPENDENT_AMBULATORY_CARE_PROVIDER_SITE_OTHER): Payer: Medicare Other

## 2016-12-02 ENCOUNTER — Ambulatory Visit (INDEPENDENT_AMBULATORY_CARE_PROVIDER_SITE_OTHER): Payer: Medicare Other | Admitting: Internal Medicine

## 2016-12-02 VITALS — BP 148/76 | HR 62 | Temp 98.7°F | Resp 14 | Ht 62.0 in | Wt 132.0 lb

## 2016-12-02 DIAGNOSIS — Z0000 Encounter for general adult medical examination without abnormal findings: Secondary | ICD-10-CM

## 2016-12-02 DIAGNOSIS — M8580 Other specified disorders of bone density and structure, unspecified site: Secondary | ICD-10-CM | POA: Diagnosis not present

## 2016-12-02 DIAGNOSIS — R059 Cough, unspecified: Secondary | ICD-10-CM

## 2016-12-02 DIAGNOSIS — Z1231 Encounter for screening mammogram for malignant neoplasm of breast: Secondary | ICD-10-CM

## 2016-12-02 DIAGNOSIS — E7800 Pure hypercholesterolemia, unspecified: Secondary | ICD-10-CM | POA: Diagnosis not present

## 2016-12-02 DIAGNOSIS — R030 Elevated blood-pressure reading, without diagnosis of hypertension: Secondary | ICD-10-CM | POA: Diagnosis not present

## 2016-12-02 DIAGNOSIS — R05 Cough: Secondary | ICD-10-CM

## 2016-12-02 DIAGNOSIS — Z0001 Encounter for general adult medical examination with abnormal findings: Secondary | ICD-10-CM | POA: Diagnosis not present

## 2016-12-02 DIAGNOSIS — K219 Gastro-esophageal reflux disease without esophagitis: Secondary | ICD-10-CM

## 2016-12-02 DIAGNOSIS — R053 Chronic cough: Secondary | ICD-10-CM

## 2016-12-02 DIAGNOSIS — Z1239 Encounter for other screening for malignant neoplasm of breast: Secondary | ICD-10-CM

## 2016-12-02 MED ORDER — PANTOPRAZOLE SODIUM 40 MG PO TBEC: 40.0000 mg | DELAYED_RELEASE_TABLET | Freq: Every day | ORAL | 2 refills | Status: DC

## 2016-12-02 MED ORDER — ALBUTEROL SULFATE HFA 108 (90 BASE) MCG/ACT IN AERS: 2.0000 | INHALATION_SPRAY | Freq: Four times a day (QID) | RESPIRATORY_TRACT | 0 refills | Status: DC | PRN

## 2016-12-02 MED ORDER — ATORVASTATIN CALCIUM 10 MG PO TABS: 10.0000 mg | ORAL_TABLET | Freq: Every day | ORAL | 3 refills | Status: DC

## 2016-12-02 NOTE — Patient Instructions (Signed)
nasacort nasal spray - 2 sprays each nostril one time per day.  Do this in the evening.   

## 2016-12-02 NOTE — Progress Notes (Signed)
Patient ID: Morgan Gallagher, female   DOB: 1950-04-26, 66 y.o.   MRN: [id number]   Subjective:    Patient ID: Morgan Gallagher, female    DOB: 01/10/1951, 66 y.o.   MRN: [id number]  HPI  Patient here for her physical exam.  She was evaluated in May after foot injury.  Initially seen in ER.  Concern initially regarding foot fracture.  Evaluated by ortho.  Found to have lateral foot soft tissue injury/contusion.  Foot is better.  She does report increased cough over the last month.  Worsened last week.  Productive of white mucus.  Increased drainage.  No fever.  No sinus pressure.  No sob.  No chest pain.  Some acid reflux.  Could be contributing to the cough.  No abdominal pain.  Bowels stable.     Past Medical History:  Diagnosis Date  . History of chicken pox   . History of diverticulitis   . Hyperlipidemia   . Osteoporosis   . Ulcer    Past Surgical History:  Procedure Laterality Date  . APPENDECTOMY  2005  . TOTAL ABDOMINAL HYSTERECTOMY W/ BILATERAL SALPINGOOPHORECTOMY  1982   Family History  Problem Relation Age of Onset  . Hyperlipidemia Mother   . Diabetes Mother   . Stroke Mother   . Hyperlipidemia Father   . Heart disease Father 30       died  . Alcohol abuse Father   . Cancer Maternal Aunt        ovarian  . Hyperlipidemia Maternal Grandmother   . Early death Brother 3       Run over by his mother  . Hypertension Paternal Aunt   . ALS Daughter    Social History   Social History  . Marital status: Single    Spouse name: N/A  . Number of children: 2  . Years of education: 13   Occupational History  . Teacher Retail buyer   Social History Main Topics  . Smoking status: Never Smoker  . Smokeless tobacco: Never Used  . Alcohol use No  . Drug use: No  . Sexual activity: Not Currently   Other Topics Concern  . None   Social History Narrative   Nikyla grew up in [place]. She is divorced as has 2 daughter [name], [name]). She  lives in [place]. [name] is living with her 2/2 illness (possible ALS). She works as a Research officer, trade [employer].       Hobbies: stainglass          Outpatient Encounter Prescriptions as of 12/02/2016  Medication Sig  . Ascorbic Acid (VITAMIN C) 1000 MG tablet Take 1,000 mg by mouth daily.  . Calcium Carbonate-Vit D-Min (CALTRATE 600+D PLUS PO) Take 1 tablet by mouth 2 (two) times daily.  . Flaxseed, Linseed, (FLAXSEED OIL) 1000 MG CAPS Take 1 capsule by mouth daily.  . Multiple Vitamins-Minerals (CENTRUM SILVER ADULT 50+ PO) Take 1 tablet by mouth daily.  . Omega-3 Fatty Acids (FISH OIL) 1200 MG CAPS Take 1 capsule by mouth daily.  . psyllium (METAMUCIL) 58.6 % powder Take 1 packet by mouth daily.  . vitamin E 400 UNIT capsule Take 400 Units by mouth daily.  . [DISCONTINUED] atorvastatin (LIPITOR) 10 MG tablet TAKE ONE TABLET BY MOUTH ONCE DAILY  . [DISCONTINUED] atorvastatin (LIPITOR) 10 MG tablet Take 1 tablet (10 mg total) by mouth daily.  Marland Kitchen albuterol (PROVENTIL HFA;VENTOLIN HFA) 108 (90 Base) MCG/ACT inhaler Inhale 2 puffs into  the lungs every 6 (six) hours as needed for wheezing or shortness of breath.  . pantoprazole (PROTONIX) 40 MG tablet Take 1 tablet (40 mg total) by mouth daily.  . [DISCONTINUED] HYDROcodone-acetaminophen (NORCO) 5-325 MG tablet Take 1 tablet by mouth every 4 (four) hours as needed for moderate pain.   No facility-administered encounter medications on file as of 12/02/2016.     Review of Systems  Constitutional: Negative for appetite change and unexpected weight change.  HENT: Positive for postnasal drip. Negative for congestion and sinus pressure.   Eyes: Negative for pain and visual disturbance.  Respiratory: Positive for cough. Negative for chest tightness and shortness of breath.   Cardiovascular: Negative for chest pain, palpitations and leg swelling.  Gastrointestinal: Negative for abdominal pain, diarrhea, nausea and vomiting.  Genitourinary: Negative for  difficulty urinating and dysuria.  Musculoskeletal: Negative for back pain and joint swelling.  Skin: Negative for color change and rash.  Neurological: Negative for dizziness, light-headedness and headaches.  Hematological: Negative for adenopathy. Does not bruise/bleed easily.  Psychiatric/Behavioral: Negative for agitation and dysphoric mood.       Objective:    Physical Exam  Constitutional: She is oriented to person, place, and time. She appears well-developed and well-nourished. No distress.  HENT:  Nose: Nose normal.  Mouth/Throat: Oropharynx is clear and moist.  Eyes: Right eye exhibits no discharge. Left eye exhibits no discharge. No scleral icterus.  Neck: Neck supple. No thyromegaly present.  Cardiovascular: Normal rate and regular rhythm.   Pulmonary/Chest: Breath sounds normal. No accessory muscle usage. No tachypnea. No respiratory distress. She has no decreased breath sounds. She has no wheezes. She has no rhonchi. Right breast exhibits no inverted nipple, no mass, no nipple discharge and no tenderness (no axillary adenopathy). Left breast exhibits no inverted nipple, no mass, no nipple discharge and no tenderness (no axilarry adenopathy).  Some increased cough with forced expiration.   Abdominal: Soft. Bowel sounds are normal. There is no tenderness.  Musculoskeletal: She exhibits no edema or tenderness.  Lymphadenopathy:    She has no cervical adenopathy.  Neurological: She is alert and oriented to person, place, and time.  Skin: Skin is warm. No rash noted. No erythema.  Psychiatric: She has a normal mood and affect. Her behavior is normal.    BP (!) 148/76 (BP Location: Left Arm, Patient Position: Sitting, Cuff Size: Normal)   Pulse 62   Temp 98.7 F (37.1 C) (Oral)   Resp 14   Ht  (1.575 m)   Wt 132 lb (59.9 kg)   SpO2 98%   BMI 24.14 kg/m  Wt Readings from Last 3 Encounters:  12/02/16 132 lb (59.9 kg)  07/04/16 136 lb (61.7 kg)  11/29/15 137 lb  12.8 oz (62.5 kg)     Lab Results  Component Value Date   WBC 5.9 10/28/2016   HGB 13.9 10/28/2016   HCT 42.0 10/28/2016   PLT 222.0 10/28/2016   GLUCOSE 100 (H) 10/28/2016   CHOL 143 10/28/2016   TRIG 95.0 10/28/2016   HDL 50.00 10/28/2016   LDLCALC 74 10/28/2016   ALT 26 10/28/2016   AST 31 10/28/2016   NA 141 10/28/2016   K 4.4 10/28/2016   CL 107 10/28/2016   CREATININE 0.66 10/28/2016   BUN 15 10/28/2016   CO2 31 10/28/2016   TSH 1.25 10/28/2016   HGBA1C 5.7 10/28/2016    Dg Foot Complete Left  Result Date: 07/14/2016 CLINICAL DATA:  66 y/o F; injury to  the foot with bruising on the top of the left foot. EXAM: LEFT FOOT - COMPLETE 3+ VIEW COMPARISON:  None. FINDINGS: There is lucency traversing the superior aspect of the navicular bone at the talonavicular joint which may represent a nondisplaced fracture. There is soft tissue swelling over the dorsum of the foot. Small plantar calcaneal enthesophyte. No other fracture or dislocation is identified. IMPRESSION: There is lucency traversing the superior aspect of the navicular bone at the talonavicular joint best seen on the lateral view which may represent a acute nondisplaced fracture. Electronically Signed   By: Mitzi Hansen M.D.   On: 07/14/2016 03:34       Assessment & Plan:   Problem List Items Addressed This Visit    Cough    Persistent cough.  Symptoms and exam as outlined.  Check cxr.  nasacort nasal spray as directed.  Robitussin DM.  Albuterol inhaler.  Hold abx until review cxr.  She declines prednisone.  Treat acid reflux.  Get her back in soon to reassess.        Elevated blood pressure reading    Blood pressure on outside checks - 122-128 systolic readings.  Have her spot check her pressure.  Treat cough.  Get her back in soon to reassess.        GERD (gastroesophageal reflux disease)    Acid reflux as outlined.  Could be contributing to the cough.  Start protonix.  Get her back in soon to  reassess.        Relevant Medications   pantoprazole (PROTONIX) 40 MG tablet   Healthcare maintenance    Physical today 12/02/16.  Mammogram 01/18/16 - Birads I.  Scheduled for f/u mammogram.  cologuard 01/09/14 - negative.  Discussed colonoscopy with her today.  Discussed repeat cologuard.  She states she just had stool test through her insurance.  Will get me a copy of results.        Hypercholesteremia    On lipitor.  Low cholesterol diet and exercise.  Follow lipid panel and liver function tests.        Osteopenia    Follow up bone density - osteopenia.  Weight bearing exercise.  Dietary calcium.         Other Visit Diagnoses    Screening for breast cancer    -  Primary   Relevant Orders   MM DIGITAL SCREENING BILATERAL   Persistent cough       Relevant Orders   DG Chest 2 View (Completed)       Dale Gerster, MD

## 2016-12-03 ENCOUNTER — Encounter: Payer: Self-pay | Admitting: Internal Medicine

## 2016-12-05 ENCOUNTER — Encounter: Payer: Self-pay | Admitting: Internal Medicine

## 2016-12-05 DIAGNOSIS — R030 Elevated blood-pressure reading, without diagnosis of hypertension: Secondary | ICD-10-CM | POA: Insufficient documentation

## 2016-12-05 DIAGNOSIS — R05 Cough: Secondary | ICD-10-CM | POA: Insufficient documentation

## 2016-12-05 DIAGNOSIS — R059 Cough, unspecified: Secondary | ICD-10-CM | POA: Insufficient documentation

## 2016-12-05 NOTE — Assessment & Plan Note (Signed)
On lipitor.  Low cholesterol diet and exercise.  Follow lipid panel and liver function tests.   

## 2016-12-05 NOTE — Assessment & Plan Note (Signed)
Blood pressure on outside checks - 122-128 systolic readings.  Have her spot check her pressure.  Treat cough.  Get her back in soon to reassess.

## 2016-12-05 NOTE — Assessment & Plan Note (Signed)
Persistent cough.  Symptoms and exam as outlined.  Check cxr.  nasacort nasal spray as directed.  Robitussin DM.  Albuterol inhaler.  Hold abx until review cxr.  She declines prednisone.  Treat acid reflux.  Get her back in soon to reassess.

## 2016-12-05 NOTE — Assessment & Plan Note (Signed)
Acid reflux as outlined.  Could be contributing to the cough.  Start protonix.  Get her back in soon to reassess.

## 2016-12-05 NOTE — Assessment & Plan Note (Signed)
Physical today 12/02/16.  Mammogram 01/18/16 - Birads I.  Scheduled for f/u mammogram.  cologuard 01/09/14 - negative.  Discussed colonoscopy with her today.  Discussed repeat cologuard.  She states she just had stool test through her insurance.  Will get me a copy of results.

## 2016-12-05 NOTE — Assessment & Plan Note (Signed)
Follow up bone density - osteopenia.  Weight bearing exercise.  Dietary calcium.

## 2017-01-03 ENCOUNTER — Ambulatory Visit (INDEPENDENT_AMBULATORY_CARE_PROVIDER_SITE_OTHER): Payer: Medicare Other | Admitting: Internal Medicine

## 2017-01-03 ENCOUNTER — Encounter: Payer: Self-pay | Admitting: Internal Medicine

## 2017-01-03 VITALS — BP 144/78 | HR 86 | Temp 98.6°F | Resp 14 | Ht 62.0 in | Wt 132.6 lb

## 2017-01-03 DIAGNOSIS — K219 Gastro-esophageal reflux disease without esophagitis: Secondary | ICD-10-CM | POA: Diagnosis not present

## 2017-01-03 DIAGNOSIS — R030 Elevated blood-pressure reading, without diagnosis of hypertension: Secondary | ICD-10-CM | POA: Diagnosis not present

## 2017-01-03 DIAGNOSIS — Z23 Encounter for immunization: Secondary | ICD-10-CM

## 2017-01-03 DIAGNOSIS — E7800 Pure hypercholesterolemia, unspecified: Secondary | ICD-10-CM

## 2017-01-03 DIAGNOSIS — F439 Reaction to severe stress, unspecified: Secondary | ICD-10-CM

## 2017-01-03 DIAGNOSIS — R739 Hyperglycemia, unspecified: Secondary | ICD-10-CM

## 2017-01-03 MED ORDER — ATORVASTATIN CALCIUM 10 MG PO TABS: 10.0000 mg | ORAL_TABLET | Freq: Every day | ORAL | 3 refills | Status: DC

## 2017-01-03 NOTE — Progress Notes (Signed)
Patient ID: Morgan Gallagher, female   DOB: 1950/06/29, 66 y.o.   MRN: [id number]   Subjective:    Patient ID: Morgan Gallagher, female    DOB: 1950/08/25, 66 y.o.   MRN: [id number]  HPI  Patient here for a scheduled follow up.  She reports she is feeling better.  Still with increased stress.  Discussed with her today.  She feels she has been handling things relatively well.  Some stress now with her job.  Tries to stay active.  No chest pain.  No sob.  No acid reflux.  No abdominal pain.  Bowels moving.  States blood pressure checks have been averaging in the 130s range.     Past Medical History:  Diagnosis Date  . History of chicken pox   . History of diverticulitis   . Hyperlipidemia   . Osteoporosis   . Ulcer    Past Surgical History:  Procedure Laterality Date  . APPENDECTOMY  2005  . TOTAL ABDOMINAL HYSTERECTOMY W/ BILATERAL SALPINGOOPHORECTOMY  1982   Family History  Problem Relation Age of Onset  . Hyperlipidemia Mother   . Diabetes Mother   . Stroke Mother   . Hyperlipidemia Father   . Heart disease Father 71       died  . Alcohol abuse Father   . Cancer Maternal Aunt        ovarian  . Hyperlipidemia Maternal Grandmother   . Early death Brother 3       Run over by his mother  . Hypertension Paternal Aunt   . ALS Daughter    Social History   Socioeconomic History  . Marital status: Single    Spouse name: None  . Number of children: 2  . Years of education: 51  . Highest education level: None  Social Needs  . Financial resource strain: None  . Food insecurity - worry: None  . Food insecurity - inability: None  . Transportation needs - medical: None  . Transportation needs - non-medical: None  Occupational History  . Occupation: Geologist, engineering    Comment: Aeronautical engineer  Tobacco Use  . Smoking status: Never Smoker  . Smokeless tobacco: Never Used  Substance and Sexual Activity  . Alcohol use: No  . Drug use: No  . Sexual activity: Not  Currently  Other Topics Concern  . None  Social History Narrative   Nastassja grew up in [place]. She is divorced as has 2 daughter [name], [name]). She lives in [place]. [name] is living with her 2/2 illness (possible ALS). She works as a Research officer, trade union.       Hobbies: stainglass          Outpatient Encounter Medications as of 01/03/2017  Medication Sig  . Ascorbic Acid (VITAMIN C) 1000 MG tablet Take 1,000 mg by mouth daily.  Marland Kitchen atorvastatin (LIPITOR) 10 MG tablet Take 1 tablet (10 mg total) by mouth daily.  . Calcium Carbonate-Vit D-Min (CALTRATE 600+D PLUS PO) Take 1 tablet by mouth 2 (two) times daily.  . Flaxseed, Linseed, (FLAXSEED OIL) 1000 MG CAPS Take 1 capsule by mouth daily.  . Multiple Vitamins-Minerals (CENTRUM SILVER ADULT 50+ PO) Take 1 tablet by mouth daily.  . Omega-3 Fatty Acids (FISH OIL) 1200 MG CAPS Take 1 capsule by mouth daily.  . pantoprazole (PROTONIX) 40 MG tablet Take 1 tablet (40 mg total) by mouth daily.  . psyllium (METAMUCIL) 58.6 % powder Take 1 packet by mouth daily.  . vitamin E 400 UNIT  capsule Take 400 Units by mouth daily.  . [DISCONTINUED] albuterol (PROVENTIL HFA;VENTOLIN HFA) 108 (90 Base) MCG/ACT inhaler Inhale 2 puffs into the lungs every 6 (six) hours as needed for wheezing or shortness of breath.  . [DISCONTINUED] atorvastatin (LIPITOR) 10 MG tablet Take 1 tablet (10 mg total) by mouth daily.   No facility-administered encounter medications on file as of 01/03/2017.     Review of Systems  Constitutional: Negative for unexpected weight change.  HENT: Negative for congestion and sinus pressure.   Respiratory: Negative for cough, chest tightness and shortness of breath.   Cardiovascular: Negative for chest pain, palpitations and leg swelling.  Gastrointestinal: Negative for abdominal pain, diarrhea, nausea and vomiting.  Genitourinary: Negative for difficulty urinating and dysuria.  Musculoskeletal: Negative for joint swelling and  myalgias.  Skin: Negative for color change and rash.  Neurological: Negative for dizziness, light-headedness and headaches.  Psychiatric/Behavioral: Negative for agitation.       Increased stress as outlined.        Objective:    Physical Exam  Constitutional: She appears well-developed and well-nourished. No distress.  HENT:  Nose: Nose normal.  Mouth/Throat: Oropharynx is clear and moist.  Neck: Neck supple. No thyromegaly present.  Cardiovascular: Normal rate and regular rhythm.  Pulmonary/Chest: Breath sounds normal. No respiratory distress. She has no wheezes.  Abdominal: Soft. Bowel sounds are normal. There is no tenderness.  Musculoskeletal: She exhibits no edema or tenderness.  Lymphadenopathy:    She has no cervical adenopathy.  Skin: No rash noted. No erythema.  Psychiatric: She has a normal mood and affect. Her behavior is normal.    BP (!) 144/78 (BP Location: Left Arm, Patient Position: Sitting, Cuff Size: Normal)   Pulse 86   Temp 98.6 F (37 C) (Oral)   Resp 14   Ht 5\' 2"  (1.575 m)   Wt 132 lb 9.6 oz (60.1 kg)   SpO2 96%   BMI 24.25 kg/m  Wt Readings from Last 3 Encounters:  01/03/17 132 lb 9.6 oz (60.1 kg)  12/02/16 132 lb (59.9 kg)  07/04/16 136 lb (61.7 kg)     Lab Results  Component Value Date   WBC 5.9 10/28/2016   HGB 13.9 10/28/2016   HCT 42.0 10/28/2016   PLT 222.0 10/28/2016   GLUCOSE 100 (H) 10/28/2016   CHOL 143 10/28/2016   TRIG 95.0 10/28/2016   HDL 50.00 10/28/2016   LDLCALC 74 10/28/2016   ALT 26 10/28/2016   AST 31 10/28/2016   NA 141 10/28/2016   K 4.4 10/28/2016   CL 107 10/28/2016   CREATININE 0.66 10/28/2016   BUN 15 10/28/2016   CO2 31 10/28/2016   TSH 1.25 10/28/2016   HGBA1C 5.7 10/28/2016    Dg Foot Complete Left  Result Date: 07/14/2016 CLINICAL DATA:  66 y/o F; injury to the foot with bruising on the top of the left foot. EXAM: LEFT FOOT - COMPLETE 3+ VIEW COMPARISON:  None. FINDINGS: There is lucency  traversing the superior aspect of the navicular bone at the talonavicular joint which may represent a nondisplaced fracture. There is soft tissue swelling over the dorsum of the foot. Small plantar calcaneal enthesophyte. No other fracture or dislocation is identified. IMPRESSION: There is lucency traversing the superior aspect of the navicular bone at the talonavicular joint best seen on the lateral view which may represent a acute nondisplaced fracture. Electronically Signed   By: Mitzi HansenLance  Furusawa-Stratton M.D.   On: 07/14/2016 03:34  Assessment & Plan:   Problem List Items Addressed This Visit    Elevated blood pressure reading    Blood pressure on outside checks in the 130s.  Elevated here.  Discussed with her today.  Feel related to the increased stress, etc.  Hold on medication. Continue to spot check her pressure.  Get her back in soon to reassess.        GERD (gastroesophageal reflux disease)    Doing well on current regimen.  No increased cough reported.        Hypercholesteremia    On lipitor.  Low cholesterol diet and exercise.  Follow lipid panel and liver function tests.        Relevant Medications   atorvastatin (LIPITOR) 10 MG tablet   Other Relevant Orders   Hepatic function panel   Lipid panel   Basic metabolic panel   Stress    Discussed with her today.  Overall feels she is doing relatively well.  Follow.  Notify me if feels desires any further intervention.         Other Visit Diagnoses    Hyperglycemia    -  Primary   Relevant Orders   Hemoglobin A1c   Encounter for immunization       Relevant Orders   Flu vaccine HIGH DOSE PF (Completed)       Dale [place], MD

## 2017-01-05 ENCOUNTER — Encounter: Payer: Self-pay | Admitting: Internal Medicine

## 2017-01-05 DIAGNOSIS — F439 Reaction to severe stress, unspecified: Secondary | ICD-10-CM | POA: Insufficient documentation

## 2017-01-05 NOTE — Assessment & Plan Note (Signed)
Doing well on current regimen.  No increased cough reported.

## 2017-01-05 NOTE — Assessment & Plan Note (Signed)
On lipitor.  Low cholesterol diet and exercise.  Follow lipid panel and liver function tests.   

## 2017-01-05 NOTE — Assessment & Plan Note (Signed)
Discussed with her today.  Overall feels she is doing relatively well.  Follow.  Notify me if feels desires any further intervention.

## 2017-01-05 NOTE — Assessment & Plan Note (Signed)
Blood pressure on outside checks in the 130s.  Elevated here.  Discussed with her today.  Feel related to the increased stress, etc.  Hold on medication. Continue to spot check her pressure.  Get her back in soon to reassess.

## 2017-01-20 ENCOUNTER — Ambulatory Visit
Admission: RE | Admit: 2017-01-20 | Discharge: 2017-01-20 | Disposition: A | Discharge status: Home / Self Care | Payer: Medicare Other | Source: Ambulatory Visit | Attending: Internal Medicine | Admitting: Internal Medicine

## 2017-01-20 DIAGNOSIS — Z1231 Encounter for screening mammogram for malignant neoplasm of breast: Secondary | ICD-10-CM | POA: Diagnosis not present

## 2017-01-20 DIAGNOSIS — Z1239 Encounter for other screening for malignant neoplasm of breast: Secondary | ICD-10-CM

## 2017-02-28 ENCOUNTER — Other Ambulatory Visit (INDEPENDENT_AMBULATORY_CARE_PROVIDER_SITE_OTHER): Payer: Medicare Other

## 2017-02-28 DIAGNOSIS — R739 Hyperglycemia, unspecified: Secondary | ICD-10-CM | POA: Diagnosis not present

## 2017-02-28 DIAGNOSIS — E7800 Pure hypercholesterolemia, unspecified: Secondary | ICD-10-CM | POA: Diagnosis not present

## 2017-02-28 LAB — BASIC METABOLIC PANEL
BUN: 16 mg/dL (ref 6–23)
CALCIUM: 9.4 mg/dL (ref 8.4–10.5)
CHLORIDE: 105 meq/L (ref 96–112)
CO2: 31 meq/L (ref 19–32)
Creatinine, Ser: 0.7 mg/dL (ref 0.40–1.20)
GFR: 88.4 mL/min (ref >60.00)
GLUCOSE: 102 mg/dL — AB (ref 70–99)
POTASSIUM: 4.3 meq/L (ref 3.5–5.1)
SODIUM: 142 meq/L (ref 135–145)

## 2017-02-28 LAB — HEPATIC FUNCTION PANEL
ALK PHOS: 70 U/L (ref 39–117)
ALT: 21 U/L (ref 0–35)
AST: 23 U/L (ref 0–37)
Albumin: 4.5 g/dL (ref 3.5–5.2)
BILIRUBIN DIRECT: 0.1 mg/dL (ref 0.0–0.3)
BILIRUBIN TOTAL: 0.5 mg/dL (ref 0.2–1.2)
TOTAL PROTEIN: 7.2 g/dL (ref 6.0–8.3)

## 2017-02-28 LAB — LIPID PANEL
Cholesterol: 165 mg/dL (ref 0–200)
HDL: 51.7 mg/dL (ref >39.00)
LDL Cholesterol: 94 mg/dL (ref 0–99)
NonHDL: 113.47
Total CHOL/HDL Ratio: 3
Triglycerides: 95 mg/dL (ref 0.0–149.0)
VLDL: 19 mg/dL (ref 0.0–40.0)

## 2017-02-28 LAB — HEMOGLOBIN A1C: Hgb A1c MFr Bld: 5.8 % (ref 4.6–6.5)

## 2017-03-02 ENCOUNTER — Encounter: Payer: Self-pay | Admitting: Internal Medicine

## 2017-03-07 ENCOUNTER — Encounter: Payer: Self-pay | Admitting: Internal Medicine

## 2017-03-07 ENCOUNTER — Ambulatory Visit (INDEPENDENT_AMBULATORY_CARE_PROVIDER_SITE_OTHER): Payer: Medicare Other | Admitting: Internal Medicine

## 2017-03-07 VITALS — BP 142/76 | HR 76 | Temp 97.3°F | Ht 62.0 in | Wt 130.4 lb

## 2017-03-07 DIAGNOSIS — F439 Reaction to severe stress, unspecified: Secondary | ICD-10-CM | POA: Diagnosis not present

## 2017-03-07 DIAGNOSIS — R739 Hyperglycemia, unspecified: Secondary | ICD-10-CM | POA: Diagnosis not present

## 2017-03-07 DIAGNOSIS — Z23 Encounter for immunization: Secondary | ICD-10-CM

## 2017-03-07 DIAGNOSIS — K219 Gastro-esophageal reflux disease without esophagitis: Secondary | ICD-10-CM

## 2017-03-07 DIAGNOSIS — R030 Elevated blood-pressure reading, without diagnosis of hypertension: Secondary | ICD-10-CM

## 2017-03-07 DIAGNOSIS — R05 Cough: Secondary | ICD-10-CM

## 2017-03-07 DIAGNOSIS — E7800 Pure hypercholesterolemia, unspecified: Secondary | ICD-10-CM | POA: Diagnosis not present

## 2017-03-07 DIAGNOSIS — R059 Cough, unspecified: Secondary | ICD-10-CM

## 2017-03-07 NOTE — Progress Notes (Signed)
Patient ID: Morgan PeronePhyllis A Rosol, female   DOB: 15-Jun-1950, 67 y.o.   MRN: [id number]   Subjective:    Patient ID: Morgan Gallagher, female    DOB: 15-Jun-1950, 67 y.o.   MRN: [id number]  HPI  Patient here for a scheduled follow up.  She is accompanied by her husband.  Increased stress.  Her daughter has been staying with them after her knee injury.  Some increased stress related to this.  Overall handling things relatively well.  Blood pressures on outside checks averaging 120s/70-80.  No chest pain.  No sob.  No acid reflux.  No abdominal pain.  Bowels moving.  Discussed pneumovax and shigrix.     Past Medical History:  Diagnosis Date  . History of chicken pox   . History of diverticulitis   . Hyperlipidemia   . Osteoporosis   . Ulcer    Past Surgical History:  Procedure Laterality Date  . APPENDECTOMY  2005  . TOTAL ABDOMINAL HYSTERECTOMY W/ BILATERAL SALPINGOOPHORECTOMY  1982   Family History  Problem Relation Age of Onset  . Hyperlipidemia Mother   . Diabetes Mother   . Stroke Mother   . Hyperlipidemia Father   . Heart disease Father 3042       died  . Alcohol abuse Father   . Cancer Maternal Aunt        ovarian  . Hyperlipidemia Maternal Grandmother   . Early death Brother 3       Run over by his mother  . Hypertension Paternal Aunt   . ALS Daughter    Social History   Socioeconomic History  . Marital status: Single    Spouse name: None  . Number of children: 2  . Years of education: 5813  . Highest education level: None  Social Needs  . Financial resource strain: None  . Food insecurity - worry: None  . Food insecurity - inability: None  . Transportation needs - medical: None  . Transportation needs - non-medical: None  Occupational History  . Occupation: Geologist, engineeringTeacher Assistant    Comment: [employer]  Tobacco Use  . Smoking status: Never Smoker  . Smokeless tobacco: Never Used  Substance and Sexual Activity  . Alcohol use: No  . Drug use: No  . Sexual  activity: Not Currently  Other Topics Concern  . None  Social History Narrative   Jamesetta Sohyllis grew up in [place]. She is divorced as has 2 daughter [name], [place]). She lives in [place]. Rodney [place] is living with her 2/2 illness (possible ALS). She works as a Research officer, trade unionteachers assistant.       Hobbies: stainglass          Outpatient Encounter Medications as of 03/07/2017  Medication Sig  . Ascorbic Acid (VITAMIN C) 1000 MG tablet Take 1,000 mg by mouth daily.  Marland Kitchen. atorvastatin (LIPITOR) 10 MG tablet Take 1 tablet (10 mg total) by mouth daily.  . Calcium Carbonate-Vit D-Min (CALTRATE 600+D PLUS PO) Take 1 tablet by mouth 2 (two) times daily.  . Flaxseed, Linseed, (FLAXSEED OIL) 1000 MG CAPS Take 1 capsule by mouth daily.  . Multiple Vitamins-Minerals (CENTRUM SILVER ADULT 50+ PO) Take 1 tablet by mouth daily.  . Omega-3 Fatty Acids (FISH OIL) 1200 MG CAPS Take 1 capsule by mouth daily.  . pantoprazole (PROTONIX) 40 MG tablet Take 1 tablet (40 mg total) by mouth daily.  . psyllium (METAMUCIL) 58.6 % powder Take 1 packet by mouth daily.  . vitamin E 400 UNIT capsule Take 400  Units by mouth daily.   No facility-administered encounter medications on file as of 03/07/2017.     Review of Systems  Constitutional: Negative for appetite change and unexpected weight change.  HENT: Negative for congestion and sinus pressure.   Respiratory: Negative for cough, chest tightness and shortness of breath.   Cardiovascular: Negative for chest pain, palpitations and leg swelling.  Gastrointestinal: Negative for abdominal pain, diarrhea, nausea and vomiting.  Genitourinary: Negative for difficulty urinating and dysuria.  Musculoskeletal: Negative for back pain and joint swelling.  Skin: Negative for color change and rash.  Neurological: Negative for dizziness, light-headedness and headaches.  Psychiatric/Behavioral: Negative for agitation and dysphoric mood.       Objective:    Physical Exam  Constitutional: She  appears well-developed and well-nourished. No distress.  HENT:  Nose: Nose normal.  Mouth/Throat: Oropharynx is clear and moist.  Neck: Neck supple. No thyromegaly present.  Cardiovascular: Normal rate and regular rhythm.  Pulmonary/Chest: Breath sounds normal. No respiratory distress. She has no wheezes.  Abdominal: Soft. Bowel sounds are normal. There is no tenderness.  Musculoskeletal: She exhibits no edema or tenderness.  Lymphadenopathy:    She has no cervical adenopathy.  Skin: No rash noted. No erythema.  Psychiatric: She has a normal mood and affect. Her behavior is normal.    BP (!) 142/76   Pulse 76   Temp (!) 97.3 F (36.3 C) (Oral)   Ht 5\' 2"  (1.575 m)   Wt 130 lb 6.4 oz (59.1 kg)   SpO2 98%   BMI 23.85 kg/m  Wt Readings from Last 3 Encounters:  03/07/17 130 lb 6.4 oz (59.1 kg)  01/03/17 132 lb 9.6 oz (60.1 kg)  12/02/16 132 lb (59.9 kg)     Lab Results  Component Value Date   WBC 5.9 10/28/2016   HGB 13.9 10/28/2016   HCT 42.0 10/28/2016   PLT 222.0 10/28/2016   GLUCOSE 102 (H) 02/28/2017   CHOL 165 02/28/2017   TRIG 95.0 02/28/2017   HDL 51.70 02/28/2017   LDLCALC 94 02/28/2017   ALT 21 02/28/2017   AST 23 02/28/2017   NA 142 02/28/2017   K 4.3 02/28/2017   CL 105 02/28/2017   CREATININE 0.70 02/28/2017   BUN 16 02/28/2017   CO2 31 02/28/2017   TSH 1.25 10/28/2016   HGBA1C 5.8 02/28/2017    Mm Screening Breast Tomo Bilateral  Result Date: 01/20/2017 CLINICAL DATA:  Screening. EXAM: 2D DIGITAL SCREENING BILATERAL MAMMOGRAM WITH CAD AND ADJUNCT TOMO COMPARISON:  Previous exam(s). ACR Breast Density Category b: There are scattered areas of fibroglandular density. FINDINGS: There are no findings suspicious for malignancy. Images were processed with CAD. IMPRESSION: No mammographic evidence of malignancy. A result letter of this screening mammogram will be mailed directly to the patient. RECOMMENDATION: Screening mammogram in one year. (Code:SM-B-01Y)  BI-RADS CATEGORY  1: Negative. Electronically Signed   By: Frederico Hamman M.D.   On: 01/20/2017 11:01       Assessment & Plan:   Problem List Items Addressed This Visit    Cough    Husband reports has resolved.        Elevated blood pressure reading    Blood pressure on outside checks as outlined.  Under good control.  Gets anxious in the office.  Have her to continue to spot check her pressure.  Notify me if elevation.        GERD (gastroesophageal reflux disease)    Stable on current regimen.  Follow.  Hypercholesteremia    On lipitor.  Low cholesterol diet and exercise.  Follow lipid panel and liver function tests.        Relevant Orders   Hepatic function panel   Lipid panel   Basic metabolic panel   Stress    Increased stress as outlined.  Discussed with her today.  She does not feel needs anything more at this point.  Follow.         Other Visit Diagnoses    Need for prophylactic vaccination against Streptococcus pneumoniae (pneumococcus)    -  Primary   Relevant Orders   Pneumococcal polysaccharide vaccine 23-valent greater than or equal to 2yo subcutaneous/IM (Completed)   Hyperglycemia       Relevant Orders   Hemoglobin A1c       Dale [place], MD

## 2017-03-07 NOTE — Progress Notes (Signed)
Pre visit review using our clinic review tool, if applicable. No additional management support is needed unless otherwise documented below in the visit note. 

## 2017-03-09 ENCOUNTER — Encounter: Payer: Self-pay | Admitting: Internal Medicine

## 2017-03-09 NOTE — Assessment & Plan Note (Signed)
On lipitor.  Low cholesterol diet and exercise.  Follow lipid panel and liver function tests.   

## 2017-03-09 NOTE — Assessment & Plan Note (Signed)
Blood pressure on outside checks as outlined.  Under good control.  Gets anxious in the office.  Have her to continue to spot check her pressure.  Notify me if elevation.

## 2017-03-09 NOTE — Assessment & Plan Note (Signed)
Husband reports has resolved.

## 2017-03-09 NOTE — Assessment & Plan Note (Signed)
Increased stress as outlined.  Discussed with her today.  She does not feel needs anything more at this point.  Follow.   

## 2017-03-09 NOTE — Assessment & Plan Note (Signed)
Stable on current regimen.  Follow.   

## 2017-06-03 ENCOUNTER — Telehealth: Payer: Self-pay | Admitting: Internal Medicine

## 2017-06-03 ENCOUNTER — Encounter: Payer: Self-pay | Admitting: Internal Medicine

## 2017-06-03 NOTE — Telephone Encounter (Signed)
Please advise 

## 2017-06-03 NOTE — Telephone Encounter (Signed)
Copied from CRM [phone]#[id number]. Topic: Quick Communication - See Telephone Encounter >> Jun 03, 2017  2:06 PM Oneal GroutSebastian, Jennifer S wrote: CRM for notification. See Telephone encounter for: 06/03/17.Checking status of surgical clearance from [facility], patient scheduled for total hip replacement on 08/08/17. Please advise

## 2017-06-04 NOTE — Telephone Encounter (Signed)
My Chart message sent

## 2017-07-01 ENCOUNTER — Other Ambulatory Visit (INDEPENDENT_AMBULATORY_CARE_PROVIDER_SITE_OTHER): Payer: Medicare Other

## 2017-07-01 ENCOUNTER — Encounter: Payer: Self-pay | Admitting: Internal Medicine

## 2017-07-01 DIAGNOSIS — R739 Hyperglycemia, unspecified: Secondary | ICD-10-CM | POA: Diagnosis not present

## 2017-07-01 DIAGNOSIS — E7800 Pure hypercholesterolemia, unspecified: Secondary | ICD-10-CM

## 2017-07-01 LAB — BASIC METABOLIC PANEL
BUN: 13 mg/dL (ref 6–23)
CALCIUM: 9.2 mg/dL (ref 8.4–10.5)
CHLORIDE: 106 meq/L (ref 96–112)
CO2: 27 meq/L (ref 19–32)
Creatinine, Ser: 0.63 mg/dL (ref 0.40–1.20)
GFR: 99.73 mL/min (ref >60.00)
Glucose, Bld: 93 mg/dL (ref 70–99)
Potassium: 4.2 mEq/L (ref 3.5–5.1)
SODIUM: 141 meq/L (ref 135–145)

## 2017-07-01 LAB — LIPID PANEL
CHOLESTEROL: 163 mg/dL (ref 0–200)
HDL: 53.1 mg/dL (ref >39.00)
LDL CALC: 88 mg/dL (ref 0–99)
NonHDL: 109.46
TRIGLYCERIDES: 107 mg/dL (ref 0.0–149.0)
Total CHOL/HDL Ratio: 3
VLDL: 21.4 mg/dL (ref 0.0–40.0)

## 2017-07-01 LAB — HEMOGLOBIN A1C: Hgb A1c MFr Bld: 5.7 % (ref 4.6–6.5)

## 2017-07-01 LAB — HEPATIC FUNCTION PANEL
ALBUMIN: 4.3 g/dL (ref 3.5–5.2)
ALK PHOS: 73 U/L (ref 39–117)
ALT: 20 U/L (ref 0–35)
AST: 23 U/L (ref 0–37)
BILIRUBIN DIRECT: 0.1 mg/dL (ref 0.0–0.3)
TOTAL PROTEIN: 7.1 g/dL (ref 6.0–8.3)
Total Bilirubin: 0.5 mg/dL (ref 0.2–1.2)

## 2017-07-03 ENCOUNTER — Other Ambulatory Visit: Payer: Medicare Other

## 2017-07-03 ENCOUNTER — Ambulatory Visit: Payer: Medicare Other | Admitting: Internal Medicine

## 2017-07-03 ENCOUNTER — Ambulatory Visit (INDEPENDENT_AMBULATORY_CARE_PROVIDER_SITE_OTHER): Payer: Medicare Other | Admitting: Internal Medicine

## 2017-07-03 VITALS — BP 132/80 | HR 81 | Temp 97.7°F | Resp 18 | Wt 134.6 lb

## 2017-07-03 DIAGNOSIS — F439 Reaction to severe stress, unspecified: Secondary | ICD-10-CM

## 2017-07-03 DIAGNOSIS — Z01818 Encounter for other preprocedural examination: Secondary | ICD-10-CM

## 2017-07-03 LAB — CBC WITH DIFFERENTIAL/PLATELET
BASOS ABS: 0.1 10*3/uL (ref 0.0–0.1)
Basophils Relative: 1.9 % (ref 0.0–3.0)
Eosinophils Absolute: 0.4 10*3/uL (ref 0.0–0.7)
Eosinophils Relative: 5.7 % — ABNORMAL HIGH (ref 0.0–5.0)
HCT: 42.1 % (ref 36.0–46.0)
HEMOGLOBIN: 14.3 g/dL (ref 12.0–15.0)
LYMPHS ABS: 1.8 10*3/uL (ref 0.7–4.0)
Lymphocytes Relative: 25.9 % (ref 12.0–46.0)
MCHC: 34 g/dL (ref 30.0–36.0)
MCV: 92.3 fl (ref 78.0–100.0)
MONOS PCT: 11.5 % (ref 3.0–12.0)
Monocytes Absolute: 0.8 10*3/uL (ref 0.1–1.0)
Neutro Abs: 3.8 10*3/uL (ref 1.4–7.7)
Neutrophils Relative %: 55 % (ref 43.0–77.0)
Platelets: 237 10*3/uL (ref 150.0–400.0)
RBC: 4.56 Mil/uL (ref 3.87–5.11)
RDW: 13.7 % (ref 11.5–15.5)
WBC: 6.9 10*3/uL (ref 4.0–10.5)

## 2017-07-03 LAB — URINALYSIS, ROUTINE W REFLEX MICROSCOPIC
BILIRUBIN URINE: NEGATIVE
HGB URINE DIPSTICK: NEGATIVE
Ketones, ur: NEGATIVE
LEUKOCYTES UA: NEGATIVE
NITRITE: NEGATIVE
RBC / HPF: NONE SEEN (ref 0–?)
Specific Gravity, Urine: 1.015 (ref 1.000–1.030)
Total Protein, Urine: NEGATIVE
Urine Glucose: NEGATIVE
Urobilinogen, UA: 0.2 (ref 0.0–1.0)
pH: 7.5 (ref 5.0–8.0)

## 2017-07-03 NOTE — Progress Notes (Signed)
Patient ID: Morgan Gallagher, female   DOB: [birth year], 67 y.o.   MRN: [id number]   Subjective:    Patient ID: Morgan Gallagher, female    DOB: Sep 16, 1950, 67 y.o.   MRN: [id number]  HPI  Patient here for pre op evaluation and follow up. Planning to have surgery left hip (left total hip).  She reports persistent increased hip pain. Limits her activity.  Needing surgery.  Reports no chest pain.  No sob.  No acid reflux.  No abdominal pain.  Bowels moving.  No urine change.  Her outside blood pressure checks are averaging - 118-120s/70s.     Past Medical History:  Diagnosis Date  . History of chicken pox   . History of diverticulitis   . Hyperlipidemia   . Osteoporosis   . Ulcer    Past Surgical History:  Procedure Laterality Date  . APPENDECTOMY  2005  . TOTAL ABDOMINAL HYSTERECTOMY W/ BILATERAL SALPINGOOPHORECTOMY  1982   Family History  Problem Relation Age of Onset  . Hyperlipidemia Mother   . Diabetes Mother   . Stroke Mother   . Hyperlipidemia Father   . Heart disease Father 43       died  . Alcohol abuse Father   . Cancer Maternal Aunt        ovarian  . Hyperlipidemia Maternal Grandmother   . Early death Brother 3       Run over by his mother  . Hypertension Paternal Aunt   . ALS Daughter    Social History   Socioeconomic History  . Marital status: Single    Spouse name: Not on file  . Number of children: 2  . Years of education: 67  . Highest education level: Not on file  Occupational History  . Occupation: Geologist, engineering    Comment: Aeronautical engineer  Social Needs  . Financial resource strain: Not on file  . Food insecurity:    Worry: Not on file    Inability: Not on file  . Transportation needs:    Medical: Not on file    Non-medical: Not on file  Tobacco Use  . Smoking status: Never Smoker  . Smokeless tobacco: Never Used  Substance and Sexual Activity  . Alcohol use: No  . Drug use: No  . Sexual activity: Not Currently  Lifestyle  .  Physical activity:    Days per week: Not on file    Minutes per session: Not on file  . Stress: Not on file  Relationships  . Social connections:    Talks on phone: Not on file    Gets together: Not on file    Attends religious service: Not on file    Active member of club or organization: Not on file    Attends meetings of clubs or organizations: Not on file    Relationship status: Not on file  Other Topics Concern  . Not on file  Social History Narrative   Kentrell grew up in [place]. She is divorced as has 2 daughter [name], [name]). She lives in [place]. [name] is living with her 2/2 illness (possible ALS). She works as a Research officer, trade union.       Hobbies: stainglass          Outpatient Encounter Medications as of 07/03/2017  Medication Sig  . Ascorbic Acid (VITAMIN C) 1000 MG tablet Take 1,000 mg by mouth daily.  Marland Kitchen atorvastatin (LIPITOR) 10 MG tablet Take 1 tablet (10 mg total) by mouth daily.  Marland Kitchen  Calcium Carbonate-Vit D-Min (CALTRATE 600+D PLUS PO) Take 1 tablet by mouth 2 (two) times daily.  . Flaxseed, Linseed, (FLAXSEED OIL) 1000 MG CAPS Take 1 capsule by mouth daily.  . Multiple Vitamins-Minerals (CENTRUM SILVER ADULT 50+ PO) Take 1 tablet by mouth daily.  . Omega-3 Fatty Acids (FISH OIL) 1200 MG CAPS Take 1 capsule by mouth daily.  . psyllium (METAMUCIL) 58.6 % powder Take 1 packet by mouth daily.  . vitamin E 400 UNIT capsule Take 400 Units by mouth daily.  . [DISCONTINUED] pantoprazole (PROTONIX) 40 MG tablet Take 1 tablet (40 mg total) by mouth daily.   No facility-administered encounter medications on file as of 07/03/2017.     Review of Systems  Constitutional: Negative for appetite change and unexpected weight change.  HENT: Negative for congestion and sinus pressure.   Respiratory: Negative for cough, chest tightness and shortness of breath.   Cardiovascular: Negative for chest pain, palpitations and leg swelling.  Gastrointestinal: Negative for abdominal  pain, diarrhea, nausea and vomiting.  Genitourinary: Negative for difficulty urinating and dysuria.  Musculoskeletal: Negative for joint swelling and myalgias.       Persistent left hip pain.    Skin: Negative for color change and rash.  Neurological: Negative for dizziness, light-headedness and headaches.  Psychiatric/Behavioral: Negative for agitation and dysphoric mood.       Objective:    Physical Exam  Constitutional: She appears well-developed and well-nourished. No distress.  HENT:  Nose: Nose normal.  Mouth/Throat: Oropharynx is clear and moist.  Neck: Neck supple. No thyromegaly present.  Cardiovascular: Normal rate and regular rhythm.  Pulmonary/Chest: Breath sounds normal. No respiratory distress. She has no wheezes.  Abdominal: Soft. Bowel sounds are normal. There is no tenderness.  Musculoskeletal: She exhibits no edema or tenderness.  Lymphadenopathy:    She has no cervical adenopathy.  Skin: No rash noted. No erythema.  Psychiatric: She has a normal mood and affect. Her behavior is normal.    BP 132/80 (BP Location: Left Arm, Patient Position: Sitting, Cuff Size: Normal)   Pulse 81   Temp 97.7 F (36.5 C) (Oral)   Resp 18   Wt 134 lb 9.6 oz (61.1 kg)   SpO2 96%   BMI 24.62 kg/m  Wt Readings from Last 3 Encounters:  07/03/17 134 lb 9.6 oz (61.1 kg)  03/07/17 130 lb 6.4 oz (59.1 kg)  01/03/17 132 lb 9.6 oz (60.1 kg)     Lab Results  Component Value Date   WBC 6.9 07/03/2017   HGB 14.3 07/03/2017   HCT 42.1 07/03/2017   PLT 237.0 07/03/2017   GLUCOSE 93 07/01/2017   CHOL 163 07/01/2017   TRIG 107.0 07/01/2017   HDL 53.10 07/01/2017   LDLCALC 88 07/01/2017   ALT 20 07/01/2017   AST 23 07/01/2017   NA 141 07/01/2017   K 4.2 07/01/2017   CL 106 07/01/2017   CREATININE 0.63 07/01/2017   BUN 13 07/01/2017   CO2 27 07/01/2017   TSH 1.25 10/28/2016   HGBA1C 5.7 07/01/2017    Mm Screening Breast Tomo Bilateral  Result Date: 01/20/2017 CLINICAL  DATA:  Screening. EXAM: 2D DIGITAL SCREENING BILATERAL MAMMOGRAM WITH CAD AND ADJUNCT TOMO COMPARISON:  Previous exam(s). ACR Breast Density Category b: There are scattered areas of fibroglandular density. FINDINGS: There are no findings suspicious for malignancy. Images were processed with CAD. IMPRESSION: No mammographic evidence of malignancy. A result letter of this screening mammogram will be mailed directly to the patient. RECOMMENDATION: Screening  mammogram in one year. (Code:SM-B-01Y) BI-RADS CATEGORY  1: Negative. Electronically Signed   By: Frederico Hamman M.D.   On: 01/20/2017 11:01       Assessment & Plan:   Problem List Items Addressed This Visit    Pre-op evaluation - Primary    Planning for left hip surgery.  No chest pain.  No sob.  Blood pressures under good control as outlined.  EKG - SR with no acute ischemic changes.  I feel from a cardiac standpoint that she is at low risk to proceed with planned surgery.  She will need close intra op and post op monitoring of heart rate and blood pressure to avoid extremes.        Relevant Orders   CBC with Differential/Platelet (Completed)   Urinalysis, Routine w reflex microscopic (Completed)   EKG 12-Lead (Completed)   Stress    Increased stress related to her pain and planned surgery.  Discussed with her today.  She does not fell needs any further intervention.  Follow.            Dale [place], MD

## 2017-07-04 ENCOUNTER — Encounter: Payer: Self-pay | Admitting: Internal Medicine

## 2017-07-07 ENCOUNTER — Ambulatory Visit: Payer: Medicare Other | Admitting: Internal Medicine

## 2017-07-07 ENCOUNTER — Ambulatory Visit: Payer: Medicare Other

## 2017-07-08 ENCOUNTER — Encounter: Payer: Self-pay | Admitting: Internal Medicine

## 2017-07-08 DIAGNOSIS — Z01818 Encounter for other preprocedural examination: Secondary | ICD-10-CM | POA: Insufficient documentation

## 2017-07-08 NOTE — Assessment & Plan Note (Signed)
Planning for left hip surgery.  No chest pain.  No sob.  Blood pressures under good control as outlined.  EKG - SR with no acute ischemic changes.  I feel from a cardiac standpoint that she is at low risk to proceed with planned surgery.  She will need close intra op and post op monitoring of heart rate and blood pressure to avoid extremes.

## 2017-07-08 NOTE — Assessment & Plan Note (Signed)
Increased stress related to her pain and planned surgery.  Discussed with her today.  She does not fell needs any further intervention.  Follow.

## 2017-07-14 ENCOUNTER — Encounter: Payer: Self-pay | Admitting: Internal Medicine

## 2017-07-14 NOTE — Telephone Encounter (Signed)
Patient has requested to continue allergy meds for a couple more days and see if that works. Seems to be helping today.

## 2017-07-16 ENCOUNTER — Other Ambulatory Visit: Payer: Self-pay

## 2017-07-16 ENCOUNTER — Telehealth: Payer: Self-pay | Admitting: Internal Medicine

## 2017-07-16 MED ORDER — PANTOPRAZOLE SODIUM 40 MG PO TBEC: 40.0000 mg | DELAYED_RELEASE_TABLET | Freq: Every day | ORAL | 2 refills | Status: DC

## 2017-07-16 NOTE — Telephone Encounter (Signed)
Ok.  Make sure to let us know if acid does not resolve.  Also make sure has f/u scheduled.

## 2017-07-16 NOTE — Telephone Encounter (Signed)
Copied from CRM [id number]. Topic: Quick Communication - See Telephone Encounter >> Jul 16, 2017  8:57 AM Oneal Grout wrote: CRM for notification. See Telephone encounter for: 07/16/17. Patient states she is having acid reflux again, having phlegm and cough. Feeling the exact same as she did when she was treated in Jan. Can something called in ? [facility] on [address]

## 2017-07-16 NOTE — Telephone Encounter (Signed)
Please advise 

## 2017-07-16 NOTE — Telephone Encounter (Signed)
Pt aware and rx sent in 

## 2017-07-16 NOTE — Telephone Encounter (Signed)
Spoke with patient on Monday and she was trying allergy medication and decided to wait and see if the allergy meds helped. Advised patient to call back on Wednesday if symptoms not better. Patient is requesting rx for acid reflux medication that she was on previously. OK to send in protonix?

## 2017-07-21 ENCOUNTER — Encounter: Payer: Self-pay | Admitting: Internal Medicine

## 2017-07-22 ENCOUNTER — Telehealth: Payer: Self-pay | Admitting: Internal Medicine

## 2017-07-22 NOTE — Telephone Encounter (Signed)
Copied from CRM [id number]. Topic: Quick Communication - See Telephone Encounter >> Jul 22, 2017 12:24 PM Diana Eves B wrote: CRM for notification. See Telephone encounter for: 07/22/17.  [facility] calling checking on medical clearance form. Pt was in the office on 07/03/17 for clearance. Please fax to [phone]. [facility] CB# [id number] ext [phone].

## 2017-07-22 NOTE — Telephone Encounter (Signed)
LMTCB

## 2017-07-22 NOTE — Telephone Encounter (Signed)
Please advise 

## 2017-07-23 NOTE — Telephone Encounter (Signed)
I can see her at 9:30 on 07/29/17.  Confirm with pt that this date is ok.

## 2017-07-23 NOTE — Telephone Encounter (Signed)
Patient has been scheduled for 5/28 at 9:30. Patient is aware of date and time.

## 2017-07-23 NOTE — Telephone Encounter (Signed)
Left message for Morgan Gallagher to call back.

## 2017-07-24 NOTE — Telephone Encounter (Signed)
Spoke with Cheri at [facility]. Advised that we had already faxed over everything for patients surgery since she had her pre-op a few weeks ago. Sent patient home at last visit with copy of all paper work. Patient gave Dennard Nip everything that was needed except for the clearance form that she sent to me yesterday. I have reviewed the form and placed in your folder for signature. Advised that I would call Cheri and the patient back once form has been sent.

## 2017-07-25 NOTE — Telephone Encounter (Signed)
See last unrouted msg 

## 2017-07-25 NOTE — Telephone Encounter (Signed)
Form signed and placed in box.  Also confirm with pt that she is not taking any aspirin or antiinflammatories.  Let me know if she is.

## 2017-07-25 NOTE — Telephone Encounter (Signed)
Patient is not taking any anti-inflammatories or aspirin

## 2017-07-29 ENCOUNTER — Ambulatory Visit (INDEPENDENT_AMBULATORY_CARE_PROVIDER_SITE_OTHER): Payer: Medicare Other

## 2017-07-29 ENCOUNTER — Ambulatory Visit (INDEPENDENT_AMBULATORY_CARE_PROVIDER_SITE_OTHER): Payer: Medicare Other | Admitting: Internal Medicine

## 2017-07-29 ENCOUNTER — Encounter: Payer: Self-pay | Admitting: Internal Medicine

## 2017-07-29 VITALS — BP 136/78 | HR 90 | Temp 98.1°F | Resp 18 | Wt 132.4 lb

## 2017-07-29 DIAGNOSIS — R05 Cough: Secondary | ICD-10-CM

## 2017-07-29 DIAGNOSIS — R053 Chronic cough: Secondary | ICD-10-CM

## 2017-07-29 DIAGNOSIS — F439 Reaction to severe stress, unspecified: Secondary | ICD-10-CM

## 2017-07-29 DIAGNOSIS — K219 Gastro-esophageal reflux disease without esophagitis: Secondary | ICD-10-CM

## 2017-07-29 DIAGNOSIS — R059 Cough, unspecified: Secondary | ICD-10-CM

## 2017-07-29 MED ORDER — PREDNISONE 10 MG PO TABS: ORAL_TABLET | ORAL | 0 refills | Status: DC

## 2017-07-29 NOTE — Progress Notes (Signed)
Patient ID: Morgan Gallagher, female   DOB: [birth year], 67 y.o.   MRN: [id number]   Subjective:    Patient ID: Morgan Gallagher, female    DOB: 10-25-1950, 67 y.o.   MRN: [id number]  HPI  Patient here as a work in with concerns regarding persistent cough and congestion.  She has started using nasacort and taking protonix.  Was getting better.  Now has worsened again.  No sinus pressure.  Increased post nasal drainage.  Increased cough - productive white mucus.  No chest congestion or tightness.  No sob.  Increased cough and has fits of coughing.  No fever.  Has surgery planned 08/12/17.  Eating.  No vomiting.  States sometimes will have significant cough - where she gags.     Past Medical History:  Diagnosis Date  . History of chicken pox   . History of diverticulitis   . Hyperlipidemia   . Osteoporosis   . Ulcer    Past Surgical History:  Procedure Laterality Date  . APPENDECTOMY  2005  . TOTAL ABDOMINAL HYSTERECTOMY W/ BILATERAL SALPINGOOPHORECTOMY  1982   Family History  Problem Relation Age of Onset  . Hyperlipidemia Mother   . Diabetes Mother   . Stroke Mother   . Hyperlipidemia Father   . Heart disease Father 62       died  . Alcohol abuse Father   . Cancer Maternal Aunt        ovarian  . Hyperlipidemia Maternal Grandmother   . Early death Brother 3       Run over by his mother  . Hypertension Paternal Aunt   . ALS Daughter    Social History   Socioeconomic History  . Marital status: Single    Spouse name: Not on file  . Number of children: 2  . Years of education: 53  . Highest education level: Not on file  Occupational History  . Occupation: Geologist, engineering    Comment: Aeronautical engineer  Social Needs  . Financial resource strain: Not on file  . Food insecurity:    Worry: Not on file    Inability: Not on file  . Transportation needs:    Medical: Not on file    Non-medical: Not on file  Tobacco Use  . Smoking status: Never Smoker  . Smokeless  tobacco: Never Used  Substance and Sexual Activity  . Alcohol use: No  . Drug use: No  . Sexual activity: Not Currently  Lifestyle  . Physical activity:    Days per week: Not on file    Minutes per session: Not on file  . Stress: Not on file  Relationships  . Social connections:    Talks on phone: Not on file    Gets together: Not on file    Attends religious service: Not on file    Active member of club or organization: Not on file    Attends meetings of clubs or organizations: Not on file    Relationship status: Not on file  Other Topics Concern  . Not on file  Social History Narrative   Belem grew up in [place]. She is divorced as has 2 daughter [name], [name]). She lives in [place]. [name] is living with her 2/2 illness (possible ALS). She works as a Research officer, trade union.       Hobbies: stainglass          Outpatient Encounter Medications as of 07/29/2017  Medication Sig  . Ascorbic Acid (VITAMIN C) 1000 MG  tablet Take 1,000 mg by mouth daily.  Marland Kitchen atorvastatin (LIPITOR) 10 MG tablet Take 1 tablet (10 mg total) by mouth daily.  . Calcium Carbonate-Vit D-Min (CALTRATE 600+D PLUS PO) Take 1 tablet by mouth 2 (two) times daily.  . Flaxseed, Linseed, (FLAXSEED OIL) 1000 MG CAPS Take 1 capsule by mouth daily.  . Multiple Vitamins-Minerals (CENTRUM SILVER ADULT 50+ PO) Take 1 tablet by mouth daily.  . Omega-3 Fatty Acids (FISH OIL) 1200 MG CAPS Take 1 capsule by mouth daily.  . pantoprazole (PROTONIX) 40 MG tablet Take 1 tablet (40 mg total) by mouth daily.  . predniSONE (DELTASONE) 10 MG tablet Take 4 tablets x 1 day and then decrease by 1/2 tablet per day until down to zero mg.  . psyllium (METAMUCIL) 58.6 % powder Take 1 packet by mouth daily.  . vitamin E 400 UNIT capsule Take 400 Units by mouth daily.   No facility-administered encounter medications on file as of 07/29/2017.     Review of Systems  Constitutional: Negative for appetite change and fever.  HENT: Positive  for congestion and postnasal drip. Negative for sinus pressure.   Respiratory: Positive for cough. Negative for chest tightness and shortness of breath.   Cardiovascular: Negative for chest pain and leg swelling.  Gastrointestinal: Negative for abdominal pain, diarrhea, nausea and vomiting.  Musculoskeletal: Negative for joint swelling and myalgias.  Skin: Negative for color change and rash.  Neurological: Negative for dizziness, light-headedness and headaches.  Psychiatric/Behavioral: Negative for agitation and dysphoric mood.       Objective:    Physical Exam  Constitutional: She appears well-developed and well-nourished. No distress.  HENT:  Mouth/Throat: Oropharynx is clear and moist.  Nares - slightly erythematous turbinates.  No tenderness to palpation over the sinuses.    Neck: Neck supple.  Cardiovascular: Normal rate and regular rhythm.  Pulmonary/Chest: Breath sounds normal. No respiratory distress. She has no wheezes.  Increased cough with forced expiration.    Lymphadenopathy:    She has no cervical adenopathy.  Skin: No rash noted. No erythema.  Psychiatric: She has a normal mood and affect. Her behavior is normal.  Increased stress with upcoming surgery.      BP 136/78 (BP Location: Left Arm, Patient Position: Sitting, Cuff Size: Normal)   Pulse 90   Temp 98.1 F (36.7 C) (Oral)   Resp 18   Wt 132 lb 6.4 oz (60.1 kg)   SpO2 96%   BMI 24.22 kg/m  Wt Readings from Last 3 Encounters:  07/29/17 132 lb 6.4 oz (60.1 kg)  07/03/17 134 lb 9.6 oz (61.1 kg)  03/07/17 130 lb 6.4 oz (59.1 kg)     Lab Results  Component Value Date   WBC 6.9 07/03/2017   HGB 14.3 07/03/2017   HCT 42.1 07/03/2017   PLT 237.0 07/03/2017   GLUCOSE 93 07/01/2017   CHOL 163 07/01/2017   TRIG 107.0 07/01/2017   HDL 53.10 07/01/2017   LDLCALC 88 07/01/2017   ALT 20 07/01/2017   AST 23 07/01/2017   NA 141 07/01/2017   K 4.2 07/01/2017   CL 106 07/01/2017   CREATININE 0.63  07/01/2017   BUN 13 07/01/2017   CO2 27 07/01/2017   TSH 1.25 10/28/2016   HGBA1C 5.7 07/01/2017    Mm Screening Breast Tomo Bilateral  Result Date: 01/20/2017 CLINICAL DATA:  Screening. EXAM: 2D DIGITAL SCREENING BILATERAL MAMMOGRAM WITH CAD AND ADJUNCT TOMO COMPARISON:  Previous exam(s). ACR Breast Density Category b: There are scattered  areas of fibroglandular density. FINDINGS: There are no findings suspicious for malignancy. Images were processed with CAD. IMPRESSION: No mammographic evidence of malignancy. A result letter of this screening mammogram will be mailed directly to the patient. RECOMMENDATION: Screening mammogram in one year. (Code:SM-B-01Y) BI-RADS CATEGORY  1: Negative. Electronically Signed   By: Frederico Hamman M.D.   On: 01/20/2017 11:01       Assessment & Plan:   Problem List Items Addressed This Visit    Cough    Persistent cough/congestion as outlined.  Exam as outlined.  Do not feel abx warranted.  Treat with saline nasal spray and nasacort nasal spray as outlined.  Robitussin DM as directed.  Prednisone taper as directed.  Check cxr.  Hold abx.  Follow.        GERD (gastroesophageal reflux disease)    Back on protonix.  No acid reflux.  Continue.        Stress    Increased stress with upcoming surgery.  Discussed with her today.  Overall relatively stable.  Follow.        Other Visit Diagnoses    Persistent cough    -  Primary   Relevant Orders   DG Chest 2 View (Completed)       Dale Tularosa, MD

## 2017-07-29 NOTE — Patient Instructions (Signed)
Saline nasal spray - flush nose at least 2-3x/day  nasacort nasal spray - 2 sprays each nostril one time per day.  Do this in the evening.    Take prednisone as directed.    Robitussin DM - twice a day as needed for cough and congestion.

## 2017-07-30 ENCOUNTER — Encounter: Payer: Self-pay | Admitting: Internal Medicine

## 2017-07-30 LAB — FECAL OCCULT BLOOD, GUAIAC: Fecal Occult Blood: NEGATIVE

## 2017-07-30 NOTE — Assessment & Plan Note (Signed)
Persistent cough/congestion as outlined.  Exam as outlined.  Do not feel abx warranted.  Treat with saline nasal spray and nasacort nasal spray as outlined.  Robitussin DM as directed.  Prednisone taper as directed.  Check cxr.  Hold abx.  Follow.

## 2017-07-30 NOTE — Assessment & Plan Note (Signed)
Back on protonix.  No acid reflux.  Continue.

## 2017-07-30 NOTE — Assessment & Plan Note (Signed)
Increased stress with upcoming surgery.  Discussed with her today.  Overall relatively stable.  Follow.

## 2017-08-11 ENCOUNTER — Encounter: Payer: Self-pay | Admitting: Internal Medicine

## 2017-08-25 ENCOUNTER — Encounter: Payer: Self-pay | Admitting: Internal Medicine

## 2017-10-31 ENCOUNTER — Ambulatory Visit (INDEPENDENT_AMBULATORY_CARE_PROVIDER_SITE_OTHER): Payer: Medicare Other | Admitting: Family Medicine

## 2017-10-31 ENCOUNTER — Other Ambulatory Visit: Payer: Self-pay

## 2017-10-31 ENCOUNTER — Encounter: Payer: Self-pay | Admitting: Family Medicine

## 2017-10-31 VITALS — BP 136/78 | HR 89 | Temp 97.9°F | Wt 135.6 lb

## 2017-10-31 DIAGNOSIS — R05 Cough: Secondary | ICD-10-CM | POA: Diagnosis not present

## 2017-10-31 DIAGNOSIS — K219 Gastro-esophageal reflux disease without esophagitis: Secondary | ICD-10-CM

## 2017-10-31 DIAGNOSIS — J22 Unspecified acute lower respiratory infection: Secondary | ICD-10-CM

## 2017-10-31 DIAGNOSIS — R062 Wheezing: Secondary | ICD-10-CM | POA: Insufficient documentation

## 2017-10-31 DIAGNOSIS — R058 Other specified cough: Secondary | ICD-10-CM | POA: Insufficient documentation

## 2017-10-31 MED ORDER — PANTOPRAZOLE SODIUM 40 MG PO TBEC: 40.0000 mg | DELAYED_RELEASE_TABLET | Freq: Every day | ORAL | 2 refills | Status: DC

## 2017-10-31 MED ORDER — DOXYCYCLINE HYCLATE 100 MG PO TABS: 100.0000 mg | ORAL_TABLET | Freq: Two times a day (BID) | ORAL | 0 refills | Status: DC

## 2017-10-31 MED ORDER — ALBUTEROL SULFATE HFA 108 (90 BASE) MCG/ACT IN AERS: 2.0000 | INHALATION_SPRAY | Freq: Four times a day (QID) | RESPIRATORY_TRACT | 0 refills | Status: AC | PRN

## 2017-10-31 MED ORDER — PREDNISONE 10 MG PO TABS: ORAL_TABLET | ORAL | 0 refills | Status: DC

## 2017-10-31 NOTE — Progress Notes (Signed)
Subjective:    Patient ID: Morgan Gallagher, female    DOB: [birth year], 67 y.o.   MRN: [id number]  HPI   Patient presents to clinic due to harsh cough x1 month.  States she is been struggling off and on with cough a few times this year.  Last was evaluated and treated for cough at the end of May.  She was given a course of steroids, which did help to improve symptoms for a long time.  CXR done 07/29/2017 FINDINGS: The lungs are adequately inflated. There is no focal infiltrate. There is no pleural effusion. The heart and pulmonary vascularity are normal. The mediastinum is normal in width. The trachea is midline. The bony thorax exhibits no acute abnormality.  IMPRESSION: There is no pneumonia nor other acute cardiopulmonary abnormality.  Currently she has no fever or chills, but does feel rundown.  States phlegm she coughs up is a thick yellow color.  Patient has no history of COPD or asthma and was never a smoker.  Patient Active Problem List   Diagnosis Date Noted  . Pre-op evaluation 07/08/2017  . Stress 01/05/2017  . Cough 12/05/2016  . Elevated blood pressure reading 12/05/2016  . Osteopenia 11/29/2015  . Left knee pain 10/02/2015  . Healthcare maintenance 11/21/2014  . Adjustment disorder with mixed anxiety and depressed mood 11/21/2014  . GERD (gastroesophageal reflux disease) 11/21/2014  . Hypercholesteremia 09/27/2013  . OP (osteoporosis) 09/27/2013   Social History   Tobacco Use  . Smoking status: Never Smoker  . Smokeless tobacco: Never Used  Substance Use Topics  . Alcohol use: No    Review of Systems  Constitutional: Negative for chills, fatigue and fever.  HENT: Negative for congestion, ear pain, sinus pain and sore throat.   Eyes: Negative.   Respiratory: +cough with phlegm, wheeze Cardiovascular: Negative for chest pain, palpitations and leg swelling.  Gastrointestinal: Negative for abdominal pain, diarrhea, nausea and vomiting.  Genitourinary:  Negative for dysuria, frequency and urgency.  Musculoskeletal: Negative for arthralgias and myalgias.  Skin: Negative for color change, pallor and rash.  Neurological: Negative for syncope, light-headedness and headaches.  Psychiatric/Behavioral: The patient is not nervous/anxious.       Objective:   Physical Exam  Constitutional: She is oriented to person, place, and time. She appears well-developed and well-nourished. No distress.  HENT:  Head: Normocephalic and atraumatic.  Eyes: EOM are normal. No scleral icterus.  Neck: Normal range of motion. Neck supple. No tracheal deviation present.  Cardiovascular: Normal rate and regular rhythm.  Pulmonary/Chest: Effort normal. No respiratory distress. She has wheezes.  +wheezes bilat upper lobes, rhonchi RLL  Neurological: She is alert and oriented to person, place, and time. No cranial nerve deficit.  Skin: Skin is warm and dry. No pallor.  Psychiatric: She has a normal mood and affect. Her behavior is normal.  Nursing note and vitals reviewed.  Vitals:   10/31/17 1302  BP: 136/78  Pulse: 89  Temp: 97.9 F (36.6 C)  SpO2: 95%      Assessment & Plan:   Recurrent productive cough/lower resp infection - due to recurrence of cough, and thick yellow coloration of phlegm we will treat with a course of doxycycline twice daily for 10 days.  Patient will also take prednisone burst for 5 days.  Wheezes - patient given prescription for albuterol inhaler to use to treat wheezing shortness of breath.  Advised to take 2 puffs 2 times a day for at least the next week, then  use as needed.  I offered to get chest x-ray today in clinic, but patient declines due to just having one in May.  Also suggested possibility of getting chest CT due to recurrence of cough, but patient would prefer to hold off on that at this time.    Patient also given refill of her Protonix, states this medication worked well for her pain control and refill is  required.  Keep appointment as already scheduled on December 03, 2017.  Patient aware she can return to clinic sooner if symptoms persist or worsen.

## 2017-10-31 NOTE — Patient Instructions (Signed)
Mucinex is a great OTC choice for cough suppression

## 2017-12-03 ENCOUNTER — Ambulatory Visit (INDEPENDENT_AMBULATORY_CARE_PROVIDER_SITE_OTHER): Payer: Medicare Other | Admitting: Internal Medicine

## 2017-12-03 ENCOUNTER — Ambulatory Visit (INDEPENDENT_AMBULATORY_CARE_PROVIDER_SITE_OTHER): Payer: Medicare Other

## 2017-12-03 VITALS — BP 146/80 | HR 94 | Temp 97.7°F | Resp 18 | Wt 135.4 lb

## 2017-12-03 DIAGNOSIS — E7800 Pure hypercholesterolemia, unspecified: Secondary | ICD-10-CM | POA: Diagnosis not present

## 2017-12-03 DIAGNOSIS — R059 Cough, unspecified: Secondary | ICD-10-CM

## 2017-12-03 DIAGNOSIS — Z0001 Encounter for general adult medical examination with abnormal findings: Secondary | ICD-10-CM

## 2017-12-03 DIAGNOSIS — R739 Hyperglycemia, unspecified: Secondary | ICD-10-CM | POA: Diagnosis not present

## 2017-12-03 DIAGNOSIS — R05 Cough: Secondary | ICD-10-CM

## 2017-12-03 DIAGNOSIS — R030 Elevated blood-pressure reading, without diagnosis of hypertension: Secondary | ICD-10-CM

## 2017-12-03 DIAGNOSIS — F439 Reaction to severe stress, unspecified: Secondary | ICD-10-CM

## 2017-12-03 DIAGNOSIS — M810 Age-related osteoporosis without current pathological fracture: Secondary | ICD-10-CM

## 2017-12-03 DIAGNOSIS — Z1239 Encounter for other screening for malignant neoplasm of breast: Secondary | ICD-10-CM | POA: Diagnosis not present

## 2017-12-03 DIAGNOSIS — K219 Gastro-esophageal reflux disease without esophagitis: Secondary | ICD-10-CM

## 2017-12-03 DIAGNOSIS — Z0000 Encounter for general adult medical examination without abnormal findings: Secondary | ICD-10-CM

## 2017-12-03 LAB — BASIC METABOLIC PANEL
BUN: 16 mg/dL (ref 6–23)
CALCIUM: 9.8 mg/dL (ref 8.4–10.5)
CO2: 29 mEq/L (ref 19–32)
CREATININE: 0.73 mg/dL (ref 0.40–1.20)
Chloride: 104 mEq/L (ref 96–112)
GFR: 84.03 mL/min (ref >60.00)
GLUCOSE: 101 mg/dL — AB (ref 70–99)
Potassium: 4.9 mEq/L (ref 3.5–5.1)
SODIUM: 141 meq/L (ref 135–145)

## 2017-12-03 LAB — HEPATIC FUNCTION PANEL
ALT: 17 U/L (ref 0–35)
AST: 21 U/L (ref 0–37)
Albumin: 4.4 g/dL (ref 3.5–5.2)
Alkaline Phosphatase: 91 U/L (ref 39–117)
BILIRUBIN TOTAL: 0.5 mg/dL (ref 0.2–1.2)
Bilirubin, Direct: 0.1 mg/dL (ref 0.0–0.3)
Total Protein: 7.3 g/dL (ref 6.0–8.3)

## 2017-12-03 LAB — LIPID PANEL
Cholesterol: 186 mg/dL (ref 0–200)
HDL: 49.7 mg/dL (ref >39.00)
LDL CALC: 108 mg/dL — AB (ref 0–99)
NonHDL: 135.97
TRIGLYCERIDES: 141 mg/dL (ref 0.0–149.0)
Total CHOL/HDL Ratio: 4
VLDL: 28.2 mg/dL (ref 0.0–40.0)

## 2017-12-03 LAB — HEMOGLOBIN A1C: Hgb A1c MFr Bld: 5.8 % (ref 4.6–6.5)

## 2017-12-03 LAB — TSH: TSH: 1.2 u[IU]/mL (ref 0.35–4.50)

## 2017-12-03 MED ORDER — PREDNISONE 10 MG PO TABS: ORAL_TABLET | ORAL | 0 refills | Status: DC

## 2017-12-03 NOTE — Assessment & Plan Note (Addendum)
Physical today 12/03/17.  Mammogram 01/20/17 - Birads I.  Schedule f/u mammogram.  Will notify me when agreeable for colonoscopy.

## 2017-12-03 NOTE — Progress Notes (Signed)
Patient ID: Morgan Gallagher, female   DOB: [birth year], 67 y.o.   MRN: [id number]   Subjective:    Patient ID: Morgan Gallagher, female    DOB: 1950/03/13, 67 y.o.   MRN: [id number]  HPI  Patient here for her physical exam.  She is s/p left THA.  Doing well.  She is walking.  Riding a bike.  Doing leg lifts.  Following with ortho.  She reports persistent cough.  I treated her prior to her surgery.  Cough resolved with prednisone.  Returned - saw Leanora Cover 10/31/17.  Was treated with prednisone and doxycycline.  Symptoms persists.  Increased cough and congestion.  Increased drainage.  Runny nose.  No vomiting or diarrhea.  No abdominal pain.  No chest pain. Handling stress.     Past Medical History:  Diagnosis Date  . History of chicken pox   . History of diverticulitis   . Hyperlipidemia   . Osteoporosis   . Ulcer    Past Surgical History:  Procedure Laterality Date  . APPENDECTOMY  2005  . TOTAL ABDOMINAL HYSTERECTOMY W/ BILATERAL SALPINGOOPHORECTOMY  1982   Family History  Problem Relation Age of Onset  . Hyperlipidemia Mother   . Diabetes Mother   . Stroke Mother   . Hyperlipidemia Father   . Heart disease Father 35       died  . Alcohol abuse Father   . Cancer Maternal Aunt        ovarian  . Hyperlipidemia Maternal Grandmother   . Early death Brother 3       Run over by his mother  . Hypertension Paternal Aunt   . ALS Daughter    Social History   Socioeconomic History  . Marital status: Single    Spouse name: Not on file  . Number of children: 2  . Years of education: 36  . Highest education level: Not on file  Occupational History  . Occupation: Geologist, engineering    Comment: Aeronautical engineer  Social Needs  . Financial resource strain: Not on file  . Food insecurity:    Worry: Not on file    Inability: Not on file  . Transportation needs:    Medical: Not on file    Non-medical: Not on file  Tobacco Use  . Smoking status: Never Smoker  . Smokeless  tobacco: Never Used  Substance and Sexual Activity  . Alcohol use: No  . Drug use: No  . Sexual activity: Not Currently  Lifestyle  . Physical activity:    Days per week: Not on file    Minutes per session: Not on file  . Stress: Not on file  Relationships  . Social connections:    Talks on phone: Not on file    Gets together: Not on file    Attends religious service: Not on file    Active member of club or organization: Not on file    Attends meetings of clubs or organizations: Not on file    Relationship status: Not on file  Other Topics Concern  . Not on file  Social History Narrative   Yaire grew up in [place]. She is divorced as has 2 daughter [name], [name]). She lives in [place]. [name] is living with her 2/2 illness (possible ALS). She works as a Research officer, trade union.       Hobbies: stainglass          Outpatient Encounter Medications as of 12/03/2017  Medication Sig  . albuterol (PROVENTIL  HFA;VENTOLIN HFA) 108 (90 Base) MCG/ACT inhaler Inhale 2 puffs into the lungs every 6 (six) hours as needed for wheezing or shortness of breath.  . Ascorbic Acid (VITAMIN C) 1000 MG tablet Take 1,000 mg by mouth daily.  Marland Kitchen atorvastatin (LIPITOR) 10 MG tablet Take 1 tablet (10 mg total) by mouth daily.  . Calcium Carbonate-Vit D-Min (CALTRATE 600+D PLUS PO) Take 1 tablet by mouth 2 (two) times daily.  . Flaxseed, Linseed, (FLAXSEED OIL) 1000 MG CAPS Take 1 capsule by mouth daily.  . Multiple Vitamins-Minerals (CENTRUM SILVER ADULT 50+ PO) Take 1 tablet by mouth daily.  . Omega-3 Fatty Acids (FISH OIL) 1200 MG CAPS Take 1 capsule by mouth daily.  . pantoprazole (PROTONIX) 40 MG tablet Take 1 tablet (40 mg total) by mouth daily.  . predniSONE (DELTASONE) 10 MG tablet Take 6 tablets x 1 day and then decrease by 1/2 tablet per day until down to zero mg.  . psyllium (METAMUCIL) 58.6 % powder Take 1 packet by mouth daily.  . vitamin E 400 UNIT capsule Take 400 Units by mouth daily.  .  [DISCONTINUED] doxycycline (VIBRA-TABS) 100 MG tablet Take 1 tablet (100 mg total) by mouth 2 (two) times daily.  . [DISCONTINUED] predniSONE (DELTASONE) 10 MG tablet Take 4 tablets x 1 day and then decrease by 1/2 tablet per day until down to zero mg.   No facility-administered encounter medications on file as of 12/03/2017.     Review of Systems  Constitutional: Negative for appetite change and unexpected weight change.  HENT: Positive for congestion and postnasal drip. Negative for sinus pressure.   Eyes: Negative for pain and visual disturbance.  Respiratory: Positive for cough. Negative for chest tightness and shortness of breath.   Cardiovascular: Negative for chest pain, palpitations and leg swelling.  Gastrointestinal: Negative for abdominal pain, diarrhea, nausea and vomiting.  Genitourinary: Negative for difficulty urinating and dysuria.  Musculoskeletal: Negative for joint swelling and myalgias.       S/p left THA.   Skin: Negative for color change and rash.  Neurological: Negative for dizziness, light-headedness and headaches.  Hematological: Negative for adenopathy. Does not bruise/bleed easily.  Psychiatric/Behavioral: Negative for agitation and dysphoric mood.       Objective:    Physical Exam  Constitutional: She is oriented to person, place, and time. She appears well-developed and well-nourished. No distress.  HENT:  Nose: Nose normal.  Mouth/Throat: Oropharynx is clear and moist.  Eyes: Right eye exhibits no discharge. Left eye exhibits no discharge. No scleral icterus.  Neck: Neck supple. No thyromegaly present.  Cardiovascular: Normal rate and regular rhythm.  Pulmonary/Chest: Breath sounds normal. No accessory muscle usage. No tachypnea. No respiratory distress. She has no decreased breath sounds. She has no wheezes. She has no rhonchi. Right breast exhibits no inverted nipple, no mass, no nipple discharge and no tenderness (no axillary adenopathy). Left breast  exhibits no inverted nipple, no mass, no nipple discharge and no tenderness (no axilarry adenopathy).  Some increased cough with forced expiration.   Abdominal: Soft. Bowel sounds are normal. There is no tenderness.  Musculoskeletal: She exhibits no edema or tenderness.  Lymphadenopathy:    She has no cervical adenopathy.  Neurological: She is alert and oriented to person, place, and time.  Skin: No rash noted. No erythema.  Psychiatric: She has a normal mood and affect. Her behavior is normal.    BP (!) 146/80 (BP Location: Left Arm, Patient Position: Sitting, Cuff Size: Normal)   Pulse  94   Temp 97.7 F (36.5 C) (Oral)   Resp 18   Wt 135 lb 6.4 oz (61.4 kg)   SpO2 97%   BMI 24.76 kg/m  Wt Readings from Last 3 Encounters:  12/03/17 135 lb 6.4 oz (61.4 kg)  10/31/17 135 lb 9.6 oz (61.5 kg)  07/29/17 132 lb 6.4 oz (60.1 kg)     Lab Results  Component Value Date   WBC 6.9 07/03/2017   HGB 14.3 07/03/2017   HCT 42.1 07/03/2017   PLT 237.0 07/03/2017   GLUCOSE 101 (H) 12/03/2017   CHOL 186 12/03/2017   TRIG 141.0 12/03/2017   HDL 49.70 12/03/2017   LDLCALC 108 (H) 12/03/2017   ALT 17 12/03/2017   AST 21 12/03/2017   NA 141 12/03/2017   K 4.9 12/03/2017   CL 104 12/03/2017   CREATININE 0.73 12/03/2017   BUN 16 12/03/2017   CO2 29 12/03/2017   TSH 1.20 12/03/2017   HGBA1C 5.8 12/03/2017    Mm Screening Breast Tomo Bilateral  Result Date: 01/20/2017 CLINICAL DATA:  Screening. EXAM: 2D DIGITAL SCREENING BILATERAL MAMMOGRAM WITH CAD AND ADJUNCT TOMO COMPARISON:  Previous exam(s). ACR Breast Density Category b: There are scattered areas of fibroglandular density. FINDINGS: There are no findings suspicious for malignancy. Images were processed with CAD. IMPRESSION: No mammographic evidence of malignancy. A result letter of this screening mammogram will be mailed directly to the patient. RECOMMENDATION: Screening mammogram in one year. (Code:SM-B-01Y) BI-RADS CATEGORY  1:  Negative. Electronically Signed   By: Frederico Hamman M.D.   On: 01/20/2017 11:01       Assessment & Plan:   Problem List Items Addressed This Visit    Cough    Persistent increased cough and congestion as outlined.  Check cxr. Treat with prednisone as directed.  nasacort nasal srpay and saline nasal spray as directed.  mucinex and robitussin as directed.  Follow.        Relevant Orders   DG Chest 2 View (Completed)   Elevated blood pressure reading    Outside blood pressure checks wnl.  Hold on additional medication.  Treat the cough as outlined.  Follow.        GERD (gastroesophageal reflux disease)    Controlled on current regimen.  Follow.       Healthcare maintenance    Physical today 12/03/17.  Mammogram 01/20/17 - [facility].  Schedule f/u mammogram.  Will notify me when agreeable for colonoscopy.       Hypercholesteremia    On lipitor.  Low cholesterol diet and exercise.  Follow lipid panel and liver function tests.        Relevant Orders   Hepatic function panel (Completed)   Lipid panel (Completed)   TSH (Completed)   Basic metabolic panel (Completed)   OP (osteoporosis)    Schedule f/u bone density.        Relevant Orders   DG Bone Density   Stress    Overall handling stress.  Follow.         Other Visit Diagnoses    Breast cancer screening    -  Primary   Relevant Orders   MM 3D SCREEN BREAST BILATERAL   Hyperglycemia       Relevant Orders   Hemoglobin A1c (Completed)   Routine general medical examination at a health care facility           Dale [place], MD

## 2017-12-04 ENCOUNTER — Encounter: Payer: Self-pay | Admitting: *Deleted

## 2017-12-06 ENCOUNTER — Encounter: Payer: Self-pay | Admitting: Internal Medicine

## 2017-12-06 NOTE — Assessment & Plan Note (Signed)
Overall handling stress.  Follow.   

## 2017-12-06 NOTE — Assessment & Plan Note (Signed)
Controlled on current regimen.  Follow.  

## 2017-12-06 NOTE — Assessment & Plan Note (Signed)
Persistent increased cough and congestion as outlined.  Check cxr. Treat with prednisone as directed.  nasacort nasal srpay and saline nasal spray as directed.  mucinex and robitussin as directed.  Follow.

## 2017-12-06 NOTE — Assessment & Plan Note (Signed)
On lipitor.  Low cholesterol diet and exercise.  Follow lipid panel and liver function tests.   

## 2017-12-06 NOTE — Assessment & Plan Note (Signed)
Outside blood pressure checks wnl.  Hold on additional medication.  Treat the cough as outlined.  Follow.

## 2017-12-06 NOTE — Assessment & Plan Note (Signed)
Schedule f/u bone density.   

## 2018-01-05 ENCOUNTER — Ambulatory Visit: Payer: Self-pay | Admitting: *Deleted

## 2018-01-05 NOTE — Telephone Encounter (Signed)
I can see her tomorrow at 12:00.  Acute sob, etc - evaluation today.

## 2018-01-05 NOTE — Telephone Encounter (Signed)
Pt scheduled  

## 2018-01-05 NOTE — Telephone Encounter (Signed)
Spoke with patient. She is feeling better than she was this morning. She is staying home and relaxing today. Confirmed no acute symptoms at this time. Patient has an appt with you on Fri. Advised if she developed any acute symptoms she should be evaluated sooner.

## 2018-01-05 NOTE — Telephone Encounter (Signed)
Patient is calling- she is having trouble catching her breath. Patient states she was exercising- walking today and could not catch her breath. Patient is SOB while on phone. She states she has inhaler- but has not used it. Told patient to use it -I will wait. Patient was better after using the inhaler. Explained to patient she needs to be seen- she is very shaken by this episode. She will not see anyone but Dr [name]. Call to office- Mal Amabile made appointment for Friday- patient instructed to go to hospital if the inhaler does not help her breathing in the meantime. She agrees that she will.  Reason for Disposition . [1] Longstanding difficulty breathing (e.g., CHF, COPD, emphysema) AND [2] WORSE than normal  Answer Assessment - Initial Assessment Questions 1. RESPIRATORY STATUS: "Describe your breathing?" (e.g., wheezing, shortness of breath, unable to speak, severe coughing)      Difficultly breathing 2. ONSET: "When did this breathing problem begin?"      Coughing- difficulty breathing- today it started after walked 3. PATTERN "Does the difficult breathing come and go, or has it been constant since it started?"      Constant- patient has not used inhaler- encouraged her to use it 4. SEVERITY: "How bad is your breathing?" (e.g., mild, moderate, severe)    - MILD: No SOB at rest, mild SOB with walking, speaks normally in sentences, can lay down, no retractions, pulse < 100.    - MODERATE: SOB at rest, SOB with minimal exertion and prefers to sit, cannot lie down flat, speaks in phrases, mild retractions, audible wheezing, pulse 100-120.    - SEVERE: Very SOB at rest, speaks in single words, struggling to breathe, sitting hunched forward, retractions, pulse > 120      severe 5. RECURRENT SYMPTOM: "Have you had difficulty breathing before?" If so, ask: "When was the last time?" and "What happened that time?"      Never this bad before 6. CARDIAC HISTORY: "Do you have any history of heart disease?"  (e.g., heart attack, angina, bypass surgery, angioplasty)      no 7. LUNG HISTORY: "Do you have any history of lung disease?"  (e.g., pulmonary embolus, asthma, emphysema)     At one time-but never diagnosed 8. CAUSE: "What do you think is causing the breathing problem?"      Cough and congestion 9. OTHER SYMPTOMS: "Do you have any other symptoms? (e.g., dizziness, runny nose, cough, chest pain, fever)     Cough, chest congestion 10. PREGNANCY: "Is there any chance you are pregnant?" "When was your last menstrual period?"       n/a 11. TRAVEL: "Have you traveled out of the country in the last month?" (e.g., travel history, exposures)       n/a  Protocols used: BREATHING DIFFICULTY-A-AH

## 2018-01-06 ENCOUNTER — Ambulatory Visit (INDEPENDENT_AMBULATORY_CARE_PROVIDER_SITE_OTHER): Payer: Medicare Other | Admitting: Internal Medicine

## 2018-01-06 ENCOUNTER — Other Ambulatory Visit: Payer: Self-pay | Admitting: Internal Medicine

## 2018-01-06 ENCOUNTER — Encounter: Payer: Self-pay | Admitting: Internal Medicine

## 2018-01-06 VITALS — BP 154/90 | HR 85 | Temp 97.5°F | Resp 16 | Wt 135.5 lb

## 2018-01-06 DIAGNOSIS — R030 Elevated blood-pressure reading, without diagnosis of hypertension: Secondary | ICD-10-CM

## 2018-01-06 DIAGNOSIS — R05 Cough: Secondary | ICD-10-CM

## 2018-01-06 DIAGNOSIS — R0602 Shortness of breath: Secondary | ICD-10-CM | POA: Diagnosis not present

## 2018-01-06 DIAGNOSIS — K219 Gastro-esophageal reflux disease without esophagitis: Secondary | ICD-10-CM | POA: Diagnosis not present

## 2018-01-06 DIAGNOSIS — E7800 Pure hypercholesterolemia, unspecified: Secondary | ICD-10-CM

## 2018-01-06 DIAGNOSIS — F439 Reaction to severe stress, unspecified: Secondary | ICD-10-CM

## 2018-01-06 DIAGNOSIS — R059 Cough, unspecified: Secondary | ICD-10-CM

## 2018-01-06 MED ORDER — ATORVASTATIN CALCIUM 10 MG PO TABS: 10.0000 mg | ORAL_TABLET | Freq: Every day | ORAL | 1 refills | Status: AC

## 2018-01-06 MED ORDER — ALBUTEROL SULFATE (2.5 MG/3ML) 0.083% IN NEBU
2.5000 mg | INHALATION_SOLUTION | Freq: Once | RESPIRATORY_TRACT | Status: AC
  Administered 2018-01-06: 2.5 mg via RESPIRATORY_TRACT

## 2018-01-06 MED ORDER — PREDNISONE 10 MG PO TABS: ORAL_TABLET | ORAL | 0 refills | Status: DC

## 2018-01-06 NOTE — Patient Instructions (Signed)
Use the albuterol inhaler as we discussed  Prednisone taper as directed  Continue mucinex and nasal sprays as you are doing

## 2018-01-06 NOTE — Progress Notes (Signed)
Patient ID: Lyda Perone, female   DOB: [birth year], 67 y.o.   MRN: [id number]   Subjective:    Patient ID: Lyda Perone, female    DOB: 1950-11-13, 67 y.o.   MRN: [id number]  HPI  Patient here as a work in with concerns regarding cough and sob.  She is accompanied by her husband.  History obtained from both of them.  She has had intermittent episodes of increased cough and congestion.  Was last evaluated 12/03/17 for persistent cough.  Was given another round of prednisone.  Symptoms resolved for approximately 10 days.  After the 10 days, has had gradual return of increased cough and some congestion.  Has been exercising.  Noticed an increase yesterday with exercising, so called to be seen.  Started mucinex.  Not using inhaler regularly.  Eating.  No vomiting.  No chest pain.  No acid reflux.  No abdominal pain.  Blood pressures on outside checks averaging 110-120/70s.  Concerned regarding the continued recurrence.  Discussed possible triggers.  No new pets.  Has one dog.  Moved into a new house in the last year.  Reports no increased mold, etc.     Past Medical History:  Diagnosis Date  . History of chicken pox   . History of diverticulitis   . Hyperlipidemia   . Osteoporosis   . Ulcer    Past Surgical History:  Procedure Laterality Date  . APPENDECTOMY  2005  . TOTAL ABDOMINAL HYSTERECTOMY W/ BILATERAL SALPINGOOPHORECTOMY  1982   Family History  Problem Relation Age of Onset  . Hyperlipidemia Mother   . Diabetes Mother   . Stroke Mother   . Hyperlipidemia Father   . Heart disease Father 77       died  . Alcohol abuse Father   . Cancer Maternal Aunt        ovarian  . Hyperlipidemia Maternal Grandmother   . Early death Brother 3       Run over by his mother  . Hypertension Paternal Aunt   . ALS Daughter    Social History   Socioeconomic History  . Marital status: Single    Spouse name: Not on file  . Number of children: 2  . Years of education: 76  . Highest  education level: Not on file  Occupational History  . Occupation: Geologist, engineering    Comment: Aeronautical engineer  Social Needs  . Financial resource strain: Not on file  . Food insecurity:    Worry: Not on file    Inability: Not on file  . Transportation needs:    Medical: Not on file    Non-medical: Not on file  Tobacco Use  . Smoking status: Never Smoker  . Smokeless tobacco: Never Used  Substance and Sexual Activity  . Alcohol use: No  . Drug use: No  . Sexual activity: Not Currently  Lifestyle  . Physical activity:    Days per week: Not on file    Minutes per session: Not on file  . Stress: Not on file  Relationships  . Social connections:    Talks on phone: Not on file    Gets together: Not on file    Attends religious service: Not on file    Active member of club or organization: Not on file    Attends meetings of clubs or organizations: Not on file    Relationship status: Not on file  Other Topics Concern  . Not on file  Social History Narrative  Roselynne grew up in [place]. She is divorced as has 2 daughter [name], [name]). She lives in [place]. [name] is living with her 2/2 illness (possible ALS). She works as a Research officer, trade union.       Hobbies: stainglass          Outpatient Encounter Medications as of 01/06/2018  Medication Sig  . albuterol (PROVENTIL HFA;VENTOLIN HFA) 108 (90 Base) MCG/ACT inhaler Inhale 2 puffs into the lungs every 6 (six) hours as needed for wheezing or shortness of breath.  . Ascorbic Acid (VITAMIN C) 1000 MG tablet Take 1,000 mg by mouth daily.  Marland Kitchen atorvastatin (LIPITOR) 10 MG tablet Take 1 tablet (10 mg total) by mouth daily.  . Calcium Carbonate-Vit D-Min (CALTRATE 600+D PLUS PO) Take 1 tablet by mouth 2 (two) times daily.  . Flaxseed, Linseed, (FLAXSEED OIL) 1000 MG CAPS Take 1 capsule by mouth daily.  . Multiple Vitamins-Minerals (CENTRUM SILVER ADULT 50+ PO) Take 1 tablet by mouth daily.  . Omega-3 Fatty Acids (FISH OIL) 1200  MG CAPS Take 1 capsule by mouth daily.  . psyllium (METAMUCIL) 58.6 % powder Take 1 packet by mouth daily.  . vitamin E 400 UNIT capsule Take 400 Units by mouth daily.  . [DISCONTINUED] atorvastatin (LIPITOR) 10 MG tablet Take 1 tablet (10 mg total) by mouth daily.  . [DISCONTINUED] pantoprazole (PROTONIX) 40 MG tablet Take 1 tablet (40 mg total) by mouth daily.  . predniSONE (DELTASONE) 10 MG tablet Take 6 tablets x 1 day and then decrease by 1/2 tablet per day until down to zero mg.  . [DISCONTINUED] predniSONE (DELTASONE) 10 MG tablet Take 6 tablets x 1 day and then decrease by 1/2 tablet per day until down to zero mg. (Patient not taking: Reported on 01/06/2018)  . [EXPIRED] albuterol (PROVENTIL) (2.5 MG/3ML) 0.083% nebulizer solution 2.5 mg    No facility-administered encounter medications on file as of 01/06/2018.     Review of Systems  Constitutional: Positive for fatigue. Negative for fever.       Eating.    HENT: Positive for congestion. Negative for sinus pressure.   Respiratory: Positive for cough.        Has "fits" of coughing.  Some sob associated with the increased cough.    Cardiovascular: Negative for palpitations and leg swelling.  Gastrointestinal: Negative for abdominal pain, diarrhea, nausea and vomiting.  Genitourinary: Negative for difficulty urinating and dysuria.  Musculoskeletal: Negative for joint swelling and myalgias.  Skin: Negative for color change and rash.  Neurological: Negative for dizziness and headaches.  Psychiatric/Behavioral: Negative for agitation and dysphoric mood.       Objective:    Physical Exam  Constitutional: She appears well-developed and well-nourished. No distress.  HENT:  Nose: Nose normal.  Mouth/Throat: Oropharynx is clear and moist.  Neck: Neck supple. No thyromegaly present.  Cardiovascular: Normal rate and regular rhythm.  Pulmonary/Chest: Breath sounds normal. No respiratory distress.  Increased cough with forced  expiration.    Abdominal: Soft. Bowel sounds are normal. There is no tenderness.  Musculoskeletal: She exhibits no edema or tenderness.  Lymphadenopathy:    She has no cervical adenopathy.  Skin: No rash noted. No erythema.  Psychiatric: She has a normal mood and affect. Her behavior is normal.    BP (!) 154/90 (BP Location: Left Arm, Patient Position: Sitting, Cuff Size: Normal)   Pulse 85   Temp (!) 97.5 F (36.4 C) (Oral)   Resp 16   Wt 135 lb 8 oz (61.5 kg)  SpO2 97%   BMI 24.78 kg/m  Wt Readings from Last 3 Encounters:  01/06/18 135 lb 8 oz (61.5 kg)  12/03/17 135 lb 6.4 oz (61.4 kg)  10/31/17 135 lb 9.6 oz (61.5 kg)     Lab Results  Component Value Date   WBC 6.9 07/03/2017   HGB 14.3 07/03/2017   HCT 42.1 07/03/2017   PLT 237.0 07/03/2017   GLUCOSE 101 (H) 12/03/2017   CHOL 186 12/03/2017   TRIG 141.0 12/03/2017   HDL 49.70 12/03/2017   LDLCALC 108 (H) 12/03/2017   ALT 17 12/03/2017   AST 21 12/03/2017   NA 141 12/03/2017   K 4.9 12/03/2017   CL 104 12/03/2017   CREATININE 0.73 12/03/2017   BUN 16 12/03/2017   CO2 29 12/03/2017   TSH 1.20 12/03/2017   HGBA1C 5.8 12/03/2017    Mm Screening Breast Tomo Bilateral  Result Date: 01/20/2017 CLINICAL DATA:  Screening. EXAM: 2D DIGITAL SCREENING BILATERAL MAMMOGRAM WITH CAD AND ADJUNCT TOMO COMPARISON:  Previous exam(s). ACR Breast Density Category b: There are scattered areas of fibroglandular density. FINDINGS: There are no findings suspicious for malignancy. Images were processed with CAD. IMPRESSION: No mammographic evidence of malignancy. A result letter of this screening mammogram will be mailed directly to the patient. RECOMMENDATION: Screening mammogram in one year. (Code:SM-B-01Y) BI-RADS CATEGORY  1: Negative. Electronically Signed   By: Frederico Hamman M.D.   On: 01/20/2017 11:01       Assessment & Plan:   Problem List Items Addressed This Visit    Cough    Persistent increased cough with  recurring episodes.  Discussed possible triggers, etc.  Just had cxr - 12/03/17.  Lungs clear.  No increased acid reflux.  Not using inhaler.  Appears to improve/resolve with prednisone.  Exam as outlined.  Was given albuterol neb here in the office. Some increased cough initially, but after - felt "more open".  Increased air movement.  Will treat with another prednisone taper.  Discussed possible side effects of prednisone.  Use the inhaler as directed.  Continue mucinex.  Discussed further pulmonary w/up.  Discussed CT scan, PFTs and referral to pulmonary.  She would like referral.  Wants to hold on scanning at this time.  Refer to pulmonary for further evaluation and w/up.        Relevant Orders   Ambulatory referral to Pulmonology   Elevated blood pressure reading    Outside checks wnl.  Hold on new medication.  Follow.        GERD (gastroesophageal reflux disease)    No increased acid reflux.  On protonix.  Continue.        Hypercholesteremia    On lipitor.  Follow lipid panel and liver function tests.        Relevant Medications   atorvastatin (LIPITOR) 10 MG tablet   Stress    Increased stress with persistent intermittent cough, etc.  Follow.         Other Visit Diagnoses    Shortness of breath    -  Primary   Related to increased cough and congestion.  No chest pain or increased sob with exercise. EKG - SR- No acute change.  Hold on further cardiac w/up.    Relevant Medications   albuterol (PROVENTIL) (2.5 MG/3ML) 0.083% nebulizer solution 2.5 mg (Completed)   Other Relevant Orders   EKG 12-Lead (Completed)   PR INHAL RX, AIRWAY OBST/DX SPUTUM INDUCT   Ambulatory referral to Pulmonology  Einar Pheasant, MD

## 2018-01-08 ENCOUNTER — Encounter: Payer: Self-pay | Admitting: Internal Medicine

## 2018-01-08 NOTE — Assessment & Plan Note (Signed)
Increased stress with persistent intermittent cough, etc.  Follow.

## 2018-01-08 NOTE — Assessment & Plan Note (Signed)
Outside checks wnl.  Hold on new medication.  Follow.

## 2018-01-08 NOTE — Assessment & Plan Note (Signed)
No increased acid reflux.  On protonix.  Continue.

## 2018-01-08 NOTE — Assessment & Plan Note (Signed)
On lipitor.  Follow lipid panel and liver function tests.   

## 2018-01-08 NOTE — Assessment & Plan Note (Signed)
Persistent increased cough with recurring episodes.  Discussed possible triggers, etc.  Just had cxr - 12/03/17.  Lungs clear.  No increased acid reflux.  Not using inhaler.  Appears to improve/resolve with prednisone.  Exam as outlined.  Was given albuterol neb here in the office. Some increased cough initially, but after - felt "more open".  Increased air movement.  Will treat with another prednisone taper.  Discussed possible side effects of prednisone.  Use the inhaler as directed.  Continue mucinex.  Discussed further pulmonary w/up.  Discussed CT scan, PFTs and referral to pulmonary.  She would like referral.  Wants to hold on scanning at this time.  Refer to pulmonary for further evaluation and w/up.

## 2018-01-09 ENCOUNTER — Ambulatory Visit: Payer: Medicare Other | Admitting: Internal Medicine

## 2018-01-13 ENCOUNTER — Encounter: Payer: Self-pay | Admitting: Internal Medicine

## 2018-01-13 ENCOUNTER — Ambulatory Visit (INDEPENDENT_AMBULATORY_CARE_PROVIDER_SITE_OTHER): Payer: Medicare Other | Admitting: Internal Medicine

## 2018-01-13 VITALS — BP 110/78 | HR 86 | Resp 16 | Ht 62.0 in | Wt 135.0 lb

## 2018-01-13 DIAGNOSIS — J411 Mucopurulent chronic bronchitis: Secondary | ICD-10-CM

## 2018-01-13 DIAGNOSIS — R05 Cough: Secondary | ICD-10-CM

## 2018-01-13 DIAGNOSIS — Z96649 Presence of unspecified artificial hip joint: Secondary | ICD-10-CM | POA: Insufficient documentation

## 2018-01-13 DIAGNOSIS — Z9049 Acquired absence of other specified parts of digestive tract: Secondary | ICD-10-CM | POA: Insufficient documentation

## 2018-01-13 DIAGNOSIS — Z90710 Acquired absence of both cervix and uterus: Secondary | ICD-10-CM | POA: Insufficient documentation

## 2018-01-13 DIAGNOSIS — R059 Cough, unspecified: Secondary | ICD-10-CM

## 2018-01-13 MED ORDER — BUDESONIDE-FORMOTEROL FUMARATE 160-4.5 MCG/ACT IN AERO: 2.0000 | INHALATION_SPRAY | Freq: Two times a day (BID) | RESPIRATORY_TRACT | 12 refills | Status: AC

## 2018-01-13 NOTE — Progress Notes (Signed)
[facility] Pulmonary Medicine Consultation      Assessment and Plan:  Chronic bronchitis, suspect allergic component, possibly allergic asthma.  - Start Symbicort inhaler. - Remove pets from bedroom.  Cough.  - As above will try empiric therapy. - If not improved at next visit will continue further work-up with testing which may include Rast, IgE - Cough onset appeared to coincide with taking care of 3 of her daughter's dogs in the home.  Recommend she move her pet out of the bedroom.  Meds ordered this encounter  Medications  . budesonide-formoterol (SYMBICORT) 160-4.5 MCG/ACT inhaler    Sig: Inhale 2 puffs into the lungs 2 (two) times daily. Rinse mouth after use    Dispense:  1 Inhaler    Refill:  12   Return in about 1 month (around 02/12/2018).   Date: 01/13/2018  MRN# [id number] Morgan Gallagher [birth year]   Morgan Gallagher is a 67 y.o. old female seen in consultation for chief complaint of:    Chief Complaint  Patient presents with  . Consult    referred by Dr. Dale Capron for eval of cough/sob:  . Cough    evaluated 10/2  with cxr... chronic bronchitis started Mucinex, she is not using inhaler daily.  . Shortness of Breath    x 2 weeks with cough and exercise    HPI:  The patient is a 67 year old female referred for persistent cough for about 10 months  She had taken prednisone for 10 days and the cough had improved but soon recurred. It seems to recur on and off, she is now on the 4th round of prednisone, it usually helps and within 10 days of stopping the cough recurs. However on this episode she is not having much relief. Around the time the cough got worse she was taking care of her daughter's 3 dogs in addition to her dog.  The cough is slightly productive of white mucus. She is currently on albuterol on most days. It does not help with the cough. She walks about 5 days per week, she is not hindered by respiratory problems.   She has a dog, in the  bedroom.  She has reflux, she takes protonix which controls it.  She has occasional sinus drainage. She has a lot of sinus congestion.  She has never had allergy testing. She is a life long non-smoker.   She has been diagnosed with "a little bit" of asthma more than 10 years ago, but she has never been on an inhaler. No recent travel, no new furniture, she moved in August of 2019.  No TB or asbestos exposure and went for yearly Tb tests which were negative.  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   **Chest x-ray 12/03/2017>> imaging personally reviewed.  Changes of chronic bronchitis noted, otherwise lungs are unremarkable. **CBC 07/03/17>> Abs eos 400.   PMHX:   Past Medical History:  Diagnosis Date  . History of chicken pox   . History of diverticulitis   . Hyperlipidemia   . Osteoporosis   . Ulcer    Surgical Hx:  Past Surgical History:  Procedure Laterality Date  . APPENDECTOMY  2005  . TOTAL ABDOMINAL HYSTERECTOMY W/ BILATERAL SALPINGOOPHORECTOMY  1982   Family Hx:  Family History  Problem Relation Age of Onset  . Hyperlipidemia Mother   . Diabetes Mother   . Stroke Mother   . Hyperlipidemia Father   . Heart disease Father 55  died  . Alcohol abuse Father   . Cancer Maternal Aunt        ovarian  . Hyperlipidemia Maternal Grandmother   . Early death Brother 3       Run over by his mother  . Hypertension Paternal Aunt   . ALS Daughter    Social Hx:   Social History   Tobacco Use  . Smoking status: Never Smoker  . Smokeless tobacco: Never Used  Substance Use Topics  . Alcohol use: No  . Drug use: No   Medication:    Current Outpatient Medications:  .  albuterol (PROVENTIL HFA;VENTOLIN HFA) 108 (90 Base) MCG/ACT inhaler, Inhale 2 puffs into the lungs every 6 (six) hours as needed for wheezing or shortness of breath., Disp: 1 Inhaler, Rfl: 0 .  Ascorbic Acid (VITAMIN C) 1000 MG tablet, Take 1,000 mg by mouth daily., Disp: , Rfl:  .   atorvastatin (LIPITOR) 10 MG tablet, Take 1 tablet (10 mg total) by mouth daily., Disp: 90 tablet, Rfl: 1 .  Calcium Carbonate-Vit D-Min (CALTRATE 600+D PLUS PO), Take 1 tablet by mouth 2 (two) times daily., Disp: , Rfl:  .  Coenzyme Q10 (COQ-10) 100 MG CAPS, Take 100 mg by mouth daily., Disp: , Rfl:  .  Flaxseed, Linseed, (FLAXSEED OIL) 1000 MG CAPS, Take 1 capsule by mouth daily., Disp: , Rfl:  .  Multiple Vitamins-Minerals (CENTRUM SILVER ADULT 50+ PO), Take 1 tablet by mouth daily., Disp: , Rfl:  .  Omega-3 Fatty Acids (FISH OIL) 1200 MG CAPS, Take 1 capsule by mouth daily., Disp: , Rfl:  .  pantoprazole (PROTONIX) 40 MG tablet, TAKE 1 TABLET(40 MG) BY MOUTH DAILY, Disp: 30 tablet, Rfl: 0 .  predniSONE (DELTASONE) 10 MG tablet, Take 6 tablets x 1 day and then decrease by 1/2 tablet per day until down to zero mg., Disp: 39 tablet, Rfl: 0 .  psyllium (METAMUCIL) 58.6 % powder, Take 1 packet by mouth daily., Disp: , Rfl:  .  vitamin E 400 UNIT capsule, Take 400 Units by mouth daily., Disp: , Rfl:    Allergies:  Augmentin [amoxicillin-pot clavulanate] and Demerol [meperidine]  Review of Systems: Gen:  Denies  fever, sweats, chills HEENT: Denies blurred vision, double vision. bleeds, sore throat Cvc:  No dizziness, chest pain. Resp:   Denies cough or sputum production, shortness of breath Gi: Denies swallowing difficulty, stomach pain. Gu:  Denies bladder incontinence, burning urine Ext:   No Joint pain, stiffness. Skin: No skin rash,  hives  Endoc:  No polyuria, polydipsia. Psych: No depression, insomnia. Other:  All other systems were reviewed with the patient and were negative other that what is mentioned in the HPI.   Physical Examination:   VS: BP 110/78 (BP Location: Left Arm, Cuff Size: Normal)   Pulse 86   Resp 16   Ht 5\' 2"  (1.575 m)   Wt 135 lb (61.2 kg)   SpO2 96%   BMI 24.69 kg/m   General Appearance: No distress  Neuro:without focal findings,  speech normal,    HEENT: PERRLA, EOM intact.   Pulmonary: normal breath sounds, No wheezing.  CardiovascularNormal S1,S2.  No m/r/g.   Abdomen: Benign, Soft, non-tender. Renal:  No costovertebral tenderness  GU:  No performed at this time. Endoc: No evident thyromegaly, no signs of acromegaly. Skin:   warm, no rashes, no ecchymosis  Extremities: normal, no cyanosis, clubbing.  Other findings:    LABORATORY PANEL:   CBC No results for input(s):  WBC, HGB, HCT, PLT in the last 168 hours. ------------------------------------------------------------------------------------------------------------------  Chemistries  No results for input(s): NA, K, CL, CO2, GLUCOSE, BUN, CREATININE, CALCIUM, MG, AST, ALT, ALKPHOS, BILITOT in the last 168 hours.  Invalid input(s): GFRCGP ------------------------------------------------------------------------------------------------------------------  Cardiac Enzymes No results for input(s): TROPONINI in the last 168 hours. ------------------------------------------------------------  RADIOLOGY:  No results found.     Thank  you for the consultation and for allowing [facility] to assist in the care of your patient. Our recommendations are noted above.  Please contact us if we can be of further service.   Wells Guiles, M.D., F.C.C.P.  Board Certified in Internal Medicine, Pulmonary Medicine, Critical Care Medicine, and Sleep Medicine.  [place] [facility] Office Number: [phone]   01/13/2018

## 2018-01-13 NOTE — Patient Instructions (Addendum)
Will start symbicort inhaler 2 puffs twice daily, rinse mouth after use.  Remove pets from the bedroom.

## 2018-01-20 ENCOUNTER — Ambulatory Visit: Payer: Medicare Other | Admitting: Internal Medicine

## 2018-01-23 ENCOUNTER — Other Ambulatory Visit: Payer: Self-pay | Admitting: Internal Medicine

## 2018-01-26 ENCOUNTER — Ambulatory Visit
Admission: RE | Admit: 2018-01-26 | Discharge: 2018-01-26 | Disposition: A | Discharge status: Home / Self Care | Payer: Medicare Other | Source: Ambulatory Visit | Attending: Internal Medicine | Admitting: Internal Medicine

## 2018-01-26 DIAGNOSIS — M810 Age-related osteoporosis without current pathological fracture: Secondary | ICD-10-CM

## 2018-01-26 DIAGNOSIS — Z1239 Encounter for other screening for malignant neoplasm of breast: Secondary | ICD-10-CM

## 2018-01-26 DIAGNOSIS — Z1231 Encounter for screening mammogram for malignant neoplasm of breast: Secondary | ICD-10-CM | POA: Insufficient documentation

## 2018-02-12 ENCOUNTER — Encounter: Payer: Self-pay | Admitting: Internal Medicine

## 2018-02-12 ENCOUNTER — Ambulatory Visit (INDEPENDENT_AMBULATORY_CARE_PROVIDER_SITE_OTHER): Payer: Medicare Other | Admitting: Internal Medicine

## 2018-02-12 VITALS — BP 118/74 | HR 98 | Ht 60.25 in | Wt 137.2 lb

## 2018-02-12 DIAGNOSIS — J42 Unspecified chronic bronchitis: Secondary | ICD-10-CM | POA: Diagnosis not present

## 2018-02-12 NOTE — Progress Notes (Signed)
[facility]      Assessment and Plan:  Chronic bronchitis, suspect allergic component, possibly allergic asthma.  - Continue Symbicort inhaler, recommended that she cut down to 1 puff twice daily for the next month.  Then can try stopping it.  If her symptoms recur she is asked to go back to using 1 puff twice daily and follow-up with us in 6 months, otherwise follow-up as needed. - Remove pets from bedroom.  Cough.  - Improved, patient satisfied.   Return in about 6 months (around 08/14/2018).   Date: 02/12/2018  MRN# [id number] Morgan Peronehyllis A Wernert Oct 26, 1950   Morgan Gallagher is a 67 y.o. old female seen in consultation for chief complaint of:    Chief Complaint  Patient presents with  . Follow-up    F/U for Cough. States her cough has significantly improved. She reports she still has some post nasal drip but it has improved as well. Uses albuterol prn and symbicort daily.     HPI:  The patient is a 67 year old female who is been having a persistent cough for nearly a year.  At last visit it was noted that she had 3 new dogs that had moved into the house that were her daughters that she was temporarily taken care of.  She had noted that the cough had improved with a course of prednisone in the past.  She was started on a Symbicort inhaler, asked her to limit her exposure to pets, and in particular remove pets from her bedroom.  Since her last visit she notes that her breathing has improved significantly and her breathing is better. She remains on symbicort 2 puffs twice per day. She is back to just her one dog.  She has a bit of sinus drainage.   She has never had allergy testing. She is a life long non-smoker.   She has been diagnosed with "a little bit" of asthma more than 10 years ago, but she has never been on an inhaler. No recent travel, no new furniture, she moved in August of 2019.  No TB or asbestos exposure and went for yearly Tb tests  which were negative.  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   **Chest x-ray 12/03/2017>> imaging personally reviewed.  Changes of chronic bronchitis noted, otherwise lungs are unremarkable. **CBC 07/03/17>> Abs eos 400.  Medication:    Current Outpatient Medications:  .  albuterol (PROVENTIL HFA;VENTOLIN HFA) 108 (90 Base) MCG/ACT inhaler, Inhale 2 puffs into the lungs every 6 (six) hours as needed for wheezing or shortness of breath., Disp: 1 Inhaler, Rfl: 0 .  Ascorbic Acid (VITAMIN C) 1000 MG tablet, Take 1,000 mg by mouth daily., Disp: , Rfl:  .  atorvastatin (LIPITOR) 10 MG tablet, Take 1 tablet (10 mg total) by mouth daily., Disp: 90 tablet, Rfl: 1 .  atorvastatin (LIPITOR) 10 MG tablet, TAKE 1 TABLET BY MOUTH DAILY, Disp: 90 tablet, Rfl: 0 .  budesonide-formoterol (SYMBICORT) 160-4.5 MCG/ACT inhaler, Inhale 2 puffs into the lungs 2 (two) times daily. Rinse mouth after use, Disp: 1 Inhaler, Rfl: 12 .  Calcium Carbonate-Vit D-Min (CALTRATE 600+D PLUS PO), Take 1 tablet by mouth 2 (two) times daily., Disp: , Rfl:  .  Coenzyme Q10 (COQ-10) 100 MG CAPS, Take 100 mg by mouth daily., Disp: , Rfl:  .  Flaxseed, Linseed, (FLAXSEED OIL) 1000 MG CAPS, Take 1 capsule by mouth daily., Disp: , Rfl:  .  Multiple Vitamins-Minerals (CENTRUM  SILVER ADULT 50+ PO), Take 1 tablet by mouth daily., Disp: , Rfl:  .  Omega-3 Fatty Acids (FISH OIL) 1200 MG CAPS, Take 1 capsule by mouth daily., Disp: , Rfl:  .  pantoprazole (PROTONIX) 40 MG tablet, TAKE 1 TABLET(40 MG) BY MOUTH DAILY, Disp: 30 tablet, Rfl: 0 .  psyllium (METAMUCIL) 58.6 % powder, Take 1 packet by mouth daily., Disp: , Rfl:  .  vitamin E 400 UNIT capsule, Take 400 Units by mouth daily., Disp: , Rfl:    Allergies:  Augmentin [amoxicillin-pot clavulanate] and Demerol [meperidine]    Review of Systems:  Constitutional: Feels well. Cardiovascular: Denies chest pain, exertional chest pain.  Pulmonary: Denies hemoptysis, pleuritic  chest pain.   The remainder of systems were reviewed and were found to be negative other than what is documented in the HPI.    Physical Examination:   VS: BP 118/74   Pulse 98   Ht 5' 0.25" (1.53 m)   Wt 137 lb 3.2 oz (62.2 kg)   SpO2 100%   BMI 26.57 kg/m   General Appearance: No distress  Neuro:without focal findings, mental status, speech normal, alert and oriented HEENT: PERRLA, EOM intact Pulmonary: No wheezing, No rales  CardiovascularNormal S1,S2.  No m/r/g.  Abdomen: Benign, Soft, non-tender, No masses Renal:  No costovertebral tenderness  GU:  No performed at this time. Endoc: No evident thyromegaly, no signs of acromegaly or Cushing features Skin:   warm, no rashes, no ecchymosis  Extremities: normal, no cyanosis, clubbing.    LABORATORY PANEL:   CBC No results for input(s): WBC, HGB, HCT, PLT in the last 168 hours. ------------------------------------------------------------------------------------------------------------------  Chemistries  No results for input(s): NA, K, CL, CO2, GLUCOSE, BUN, CREATININE, CALCIUM, MG, AST, ALT, ALKPHOS, BILITOT in the last 168 hours.  Invalid input(s): GFRCGP ------------------------------------------------------------------------------------------------------------------  Cardiac Enzymes No results for input(s): TROPONINI in the last 168 hours. ------------------------------------------------------------  RADIOLOGY:  No results found.     Thank  you for the consultation and for allowing [facility] to assist in the care of your patient. Our recommendations are noted above.  Please contact us if we can be of further service.   Wells Guiles, M.D., F.C.C.P.  Board Certified in Internal Medicine, Pulmonary Medicine, Critical Care Medicine, and Sleep Medicine.  [place] [facility] Office Number: [phone]   02/12/2018

## 2018-02-12 NOTE — Patient Instructions (Addendum)
Will decrease symbicort to one puff twice daily through the end of the year, then stop.  If symptoms recur, then go back to one puff twice daily.   If you are still on the inhaler in 6 months, then see us back then.

## 2018-06-28 IMAGING — MG 2D DIGITAL SCREENING BILATERAL MAMMOGRAM WITH CAD AND ADJUNCT TO
8 of 12 series · 8 of 28 positions shown · non-contrast
Comparison: Previous exam(s).

CLINICAL DATA: Screening.

EXAM:
2D DIGITAL SCREENING BILATERAL MAMMOGRAM WITH CAD AND ADJUNCT TOMO

[R CC]
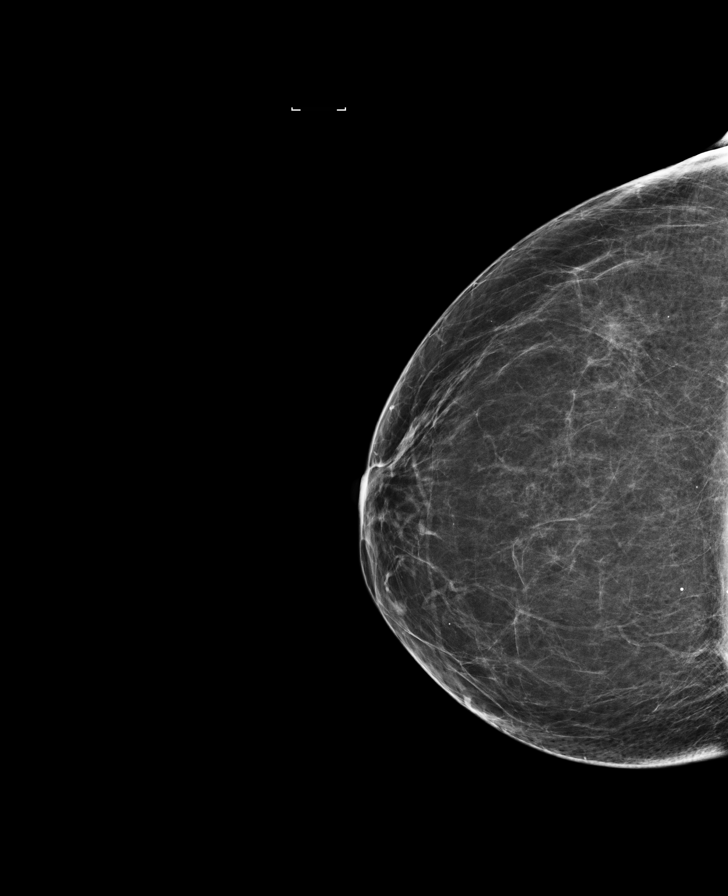

[R MLO synth-2D]
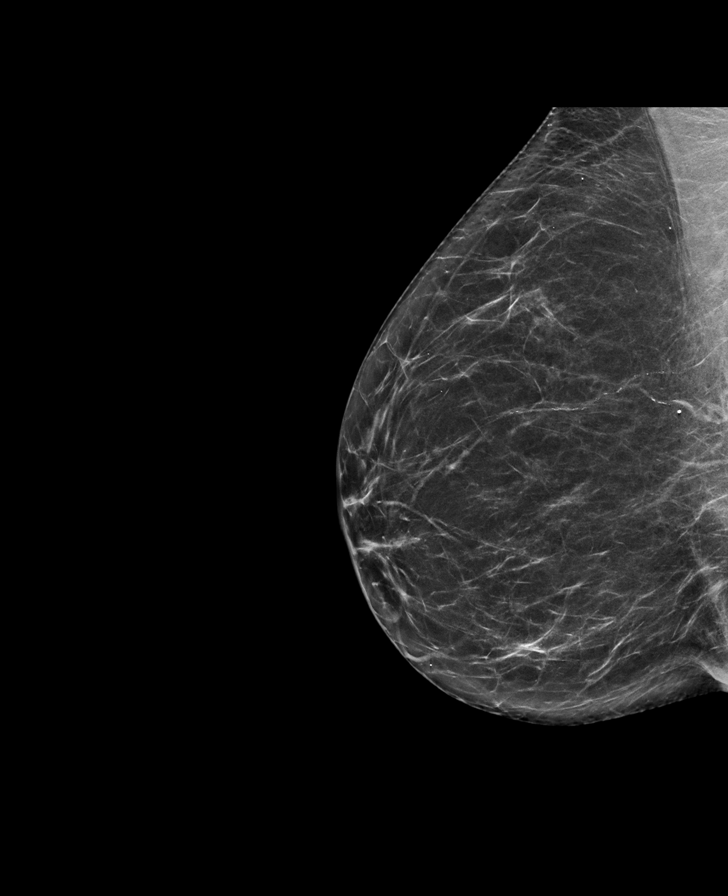

[L MLO]
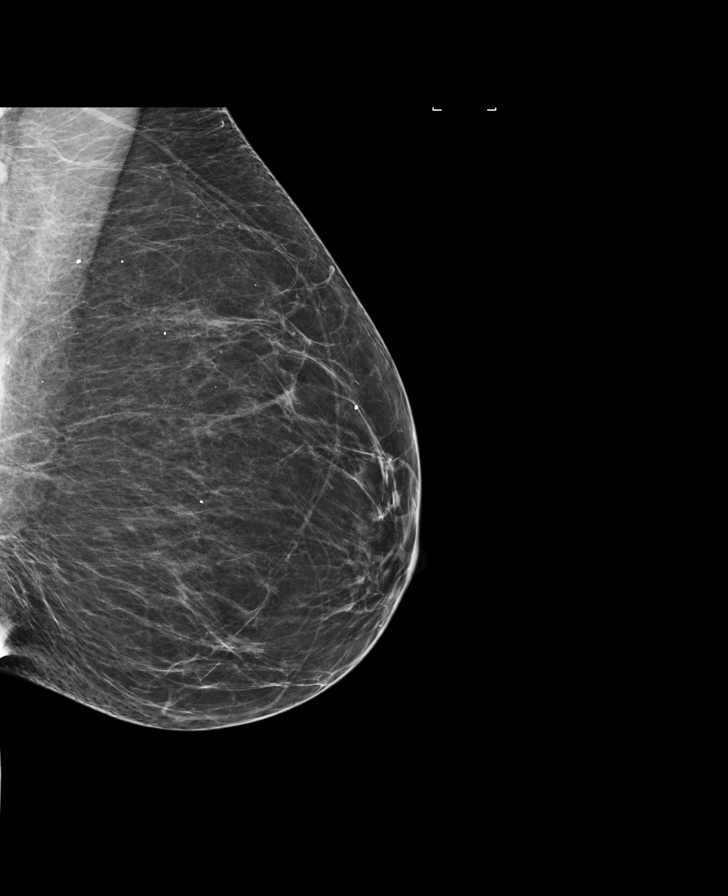

[R CC synth-2D]
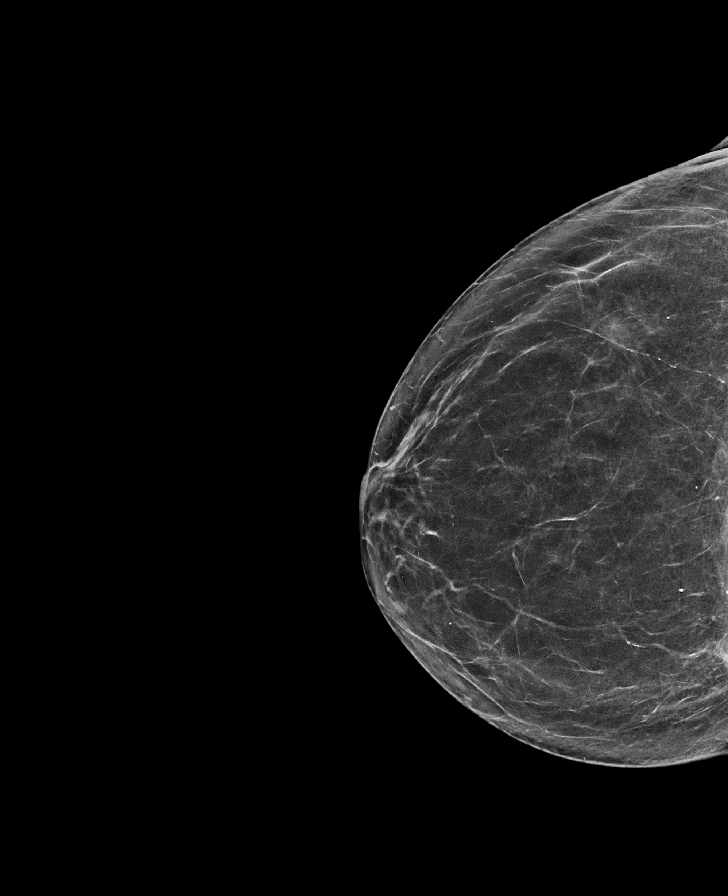

[L CC]
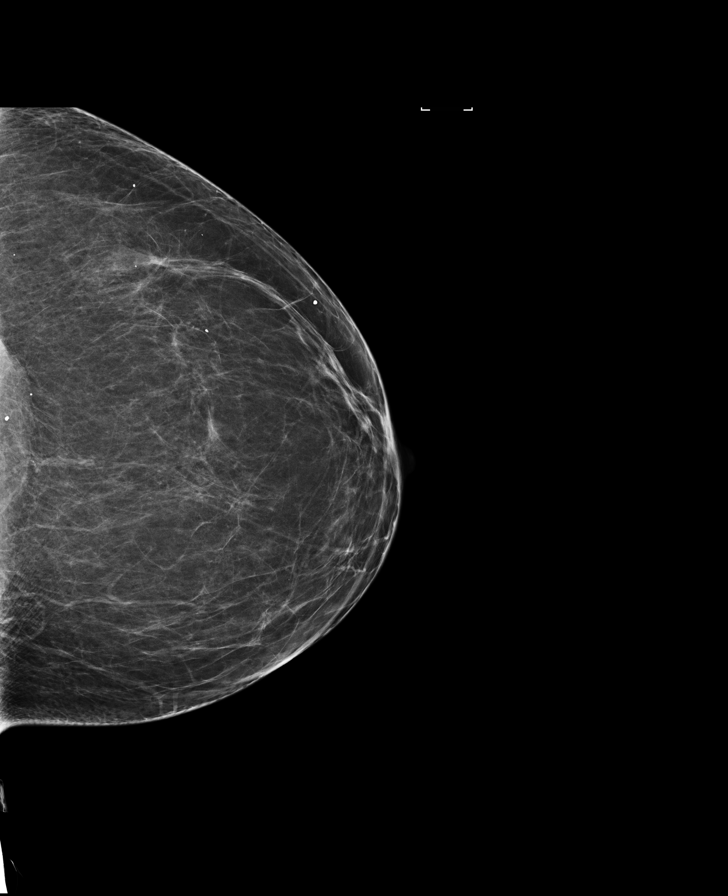

[L MLO synth-2D]
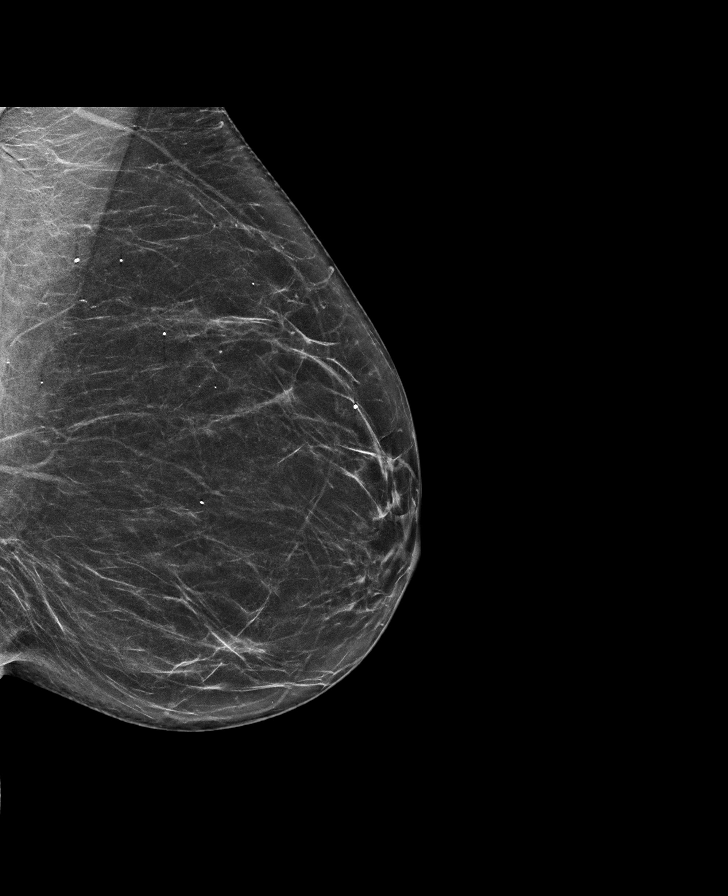

[L CC synth-2D]
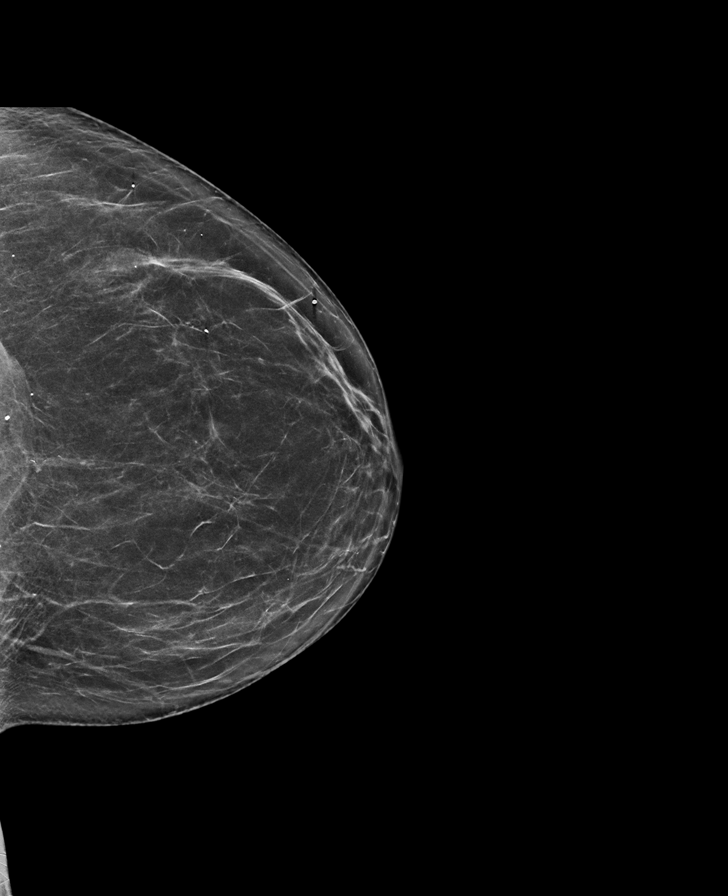

[R MLO]
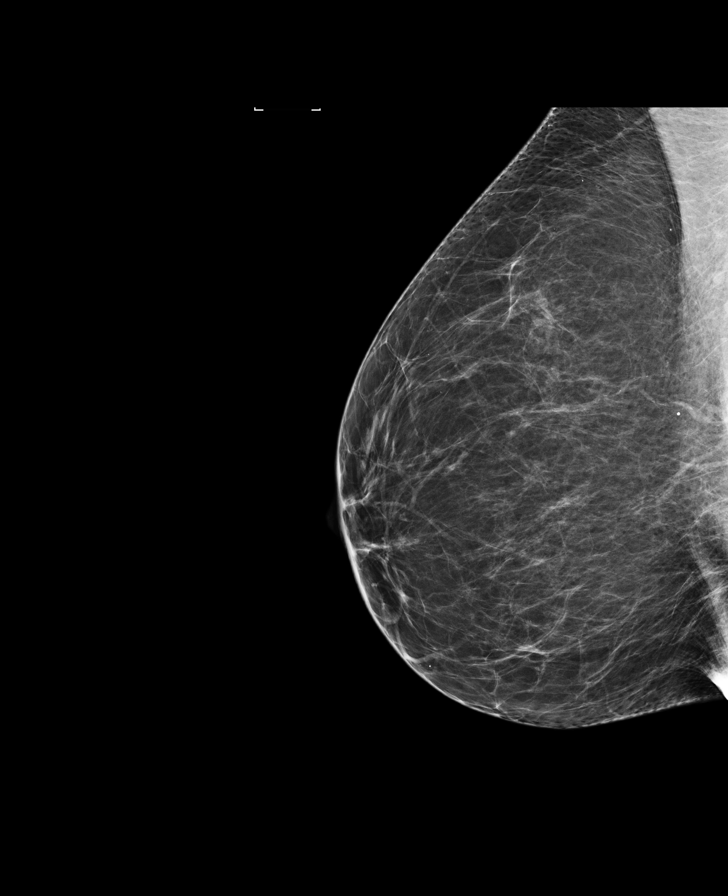

[8 of 28 positions shown; findings below may reference images not displayed]

ACR Breast Density Category b: There are scattered areas of
fibroglandular density.
FINDINGS: There are no findings suspicious for malignancy. Images were
processed with CAD.
IMPRESSION: No mammographic evidence of malignancy. A result letter of this
screening mammogram will be mailed directly to the patient.

RECOMMENDATION:
Screening mammogram in one year. (Code:97-6-RS4)

BI-RADS CATEGORY  1: Negative.

## 2018-10-20 ENCOUNTER — Encounter: Payer: Self-pay | Admitting: Internal Medicine

## 2019-01-12 ENCOUNTER — Other Ambulatory Visit: Payer: Self-pay | Admitting: Internal Medicine

## 2019-01-12 DIAGNOSIS — Z1231 Encounter for screening mammogram for malignant neoplasm of breast: Secondary | ICD-10-CM

## 2019-02-02 ENCOUNTER — Ambulatory Visit
Admission: RE | Admit: 2019-02-02 | Discharge: 2019-02-02 | Disposition: A | Discharge status: Home / Self Care | Payer: Medicare Other | Source: Ambulatory Visit | Attending: Internal Medicine | Admitting: Internal Medicine

## 2019-02-02 DIAGNOSIS — Z1231 Encounter for screening mammogram for malignant neoplasm of breast: Secondary | ICD-10-CM | POA: Diagnosis present

## 2019-07-05 DIAGNOSIS — E7800 Pure hypercholesterolemia, unspecified: Secondary | ICD-10-CM | POA: Diagnosis not present

## 2019-07-05 DIAGNOSIS — J42 Unspecified chronic bronchitis: Secondary | ICD-10-CM | POA: Diagnosis not present

## 2019-07-12 DIAGNOSIS — R030 Elevated blood-pressure reading, without diagnosis of hypertension: Secondary | ICD-10-CM | POA: Diagnosis not present

## 2019-07-12 DIAGNOSIS — E7800 Pure hypercholesterolemia, unspecified: Secondary | ICD-10-CM | POA: Diagnosis not present

## 2019-07-12 DIAGNOSIS — M8580 Other specified disorders of bone density and structure, unspecified site: Secondary | ICD-10-CM | POA: Diagnosis not present

## 2019-07-12 DIAGNOSIS — J42 Unspecified chronic bronchitis: Secondary | ICD-10-CM | POA: Diagnosis not present

## 2019-07-12 DIAGNOSIS — R251 Tremor, unspecified: Secondary | ICD-10-CM | POA: Diagnosis not present

## 2019-07-12 DIAGNOSIS — F430 Acute stress reaction: Secondary | ICD-10-CM | POA: Diagnosis not present

## 2019-07-12 DIAGNOSIS — K219 Gastro-esophageal reflux disease without esophagitis: Secondary | ICD-10-CM | POA: Diagnosis not present

## 2019-10-08 DIAGNOSIS — Z85828 Personal history of other malignant neoplasm of skin: Secondary | ICD-10-CM | POA: Diagnosis not present

## 2019-10-08 DIAGNOSIS — L738 Other specified follicular disorders: Secondary | ICD-10-CM | POA: Diagnosis not present

## 2019-10-08 DIAGNOSIS — D1801 Hemangioma of skin and subcutaneous tissue: Secondary | ICD-10-CM | POA: Diagnosis not present

## 2019-10-08 DIAGNOSIS — Z08 Encounter for follow-up examination after completed treatment for malignant neoplasm: Secondary | ICD-10-CM | POA: Diagnosis not present

## 2019-10-08 DIAGNOSIS — L821 Other seborrheic keratosis: Secondary | ICD-10-CM | POA: Diagnosis not present

## 2020-01-06 DIAGNOSIS — J42 Unspecified chronic bronchitis: Secondary | ICD-10-CM | POA: Diagnosis not present

## 2020-01-06 DIAGNOSIS — K219 Gastro-esophageal reflux disease without esophagitis: Secondary | ICD-10-CM | POA: Diagnosis not present

## 2020-01-06 DIAGNOSIS — E7800 Pure hypercholesterolemia, unspecified: Secondary | ICD-10-CM | POA: Diagnosis not present

## 2020-01-13 ENCOUNTER — Other Ambulatory Visit: Payer: Self-pay | Admitting: Internal Medicine

## 2020-01-13 DIAGNOSIS — E785 Hyperlipidemia, unspecified: Secondary | ICD-10-CM | POA: Diagnosis not present

## 2020-01-13 DIAGNOSIS — Z0000 Encounter for general adult medical examination without abnormal findings: Secondary | ICD-10-CM | POA: Diagnosis not present

## 2020-01-13 DIAGNOSIS — J42 Unspecified chronic bronchitis: Secondary | ICD-10-CM | POA: Diagnosis not present

## 2020-01-13 DIAGNOSIS — Z1231 Encounter for screening mammogram for malignant neoplasm of breast: Secondary | ICD-10-CM

## 2020-01-13 DIAGNOSIS — K5792 Diverticulitis of intestine, part unspecified, without perforation or abscess without bleeding: Secondary | ICD-10-CM | POA: Diagnosis not present

## 2020-01-13 DIAGNOSIS — M810 Age-related osteoporosis without current pathological fracture: Secondary | ICD-10-CM

## 2020-01-13 DIAGNOSIS — Z1211 Encounter for screening for malignant neoplasm of colon: Secondary | ICD-10-CM | POA: Diagnosis not present

## 2020-01-19 DIAGNOSIS — H43813 Vitreous degeneration, bilateral: Secondary | ICD-10-CM | POA: Diagnosis not present

## 2020-01-19 DIAGNOSIS — H52223 Regular astigmatism, bilateral: Secondary | ICD-10-CM | POA: Diagnosis not present

## 2020-01-19 DIAGNOSIS — H2513 Age-related nuclear cataract, bilateral: Secondary | ICD-10-CM | POA: Diagnosis not present

## 2020-01-19 DIAGNOSIS — H353131 Nonexudative age-related macular degeneration, bilateral, early dry stage: Secondary | ICD-10-CM | POA: Diagnosis not present

## 2020-01-19 DIAGNOSIS — H5203 Hypermetropia, bilateral: Secondary | ICD-10-CM | POA: Diagnosis not present

## 2020-01-19 DIAGNOSIS — H524 Presbyopia: Secondary | ICD-10-CM | POA: Diagnosis not present

## 2020-01-19 DIAGNOSIS — H3562 Retinal hemorrhage, left eye: Secondary | ICD-10-CM | POA: Diagnosis not present

## 2020-01-23 DIAGNOSIS — Z1211 Encounter for screening for malignant neoplasm of colon: Secondary | ICD-10-CM | POA: Diagnosis not present

## 2020-02-05 LAB — EXTERNAL GENERIC LAB PROCEDURE: COLOGUARD: NEGATIVE

## 2020-02-05 LAB — COLOGUARD: COLOGUARD: NEGATIVE

## 2020-02-16 DIAGNOSIS — I788 Other diseases of capillaries: Secondary | ICD-10-CM | POA: Diagnosis not present

## 2020-02-16 DIAGNOSIS — D485 Neoplasm of uncertain behavior of skin: Secondary | ICD-10-CM | POA: Diagnosis not present

## 2020-02-16 DIAGNOSIS — L821 Other seborrheic keratosis: Secondary | ICD-10-CM | POA: Diagnosis not present

## 2020-02-17 ENCOUNTER — Ambulatory Visit
Admission: RE | Admit: 2020-02-17 | Discharge: 2020-02-17 | Disposition: A | Discharge status: Home / Self Care | Payer: Medicare PPO | Source: Ambulatory Visit | Attending: Internal Medicine | Admitting: Internal Medicine

## 2020-02-17 ENCOUNTER — Other Ambulatory Visit: Payer: Self-pay

## 2020-02-17 DIAGNOSIS — M810 Age-related osteoporosis without current pathological fracture: Secondary | ICD-10-CM | POA: Insufficient documentation

## 2020-02-17 DIAGNOSIS — Z1231 Encounter for screening mammogram for malignant neoplasm of breast: Secondary | ICD-10-CM | POA: Diagnosis not present

## 2020-02-17 DIAGNOSIS — Z780 Asymptomatic menopausal state: Secondary | ICD-10-CM | POA: Diagnosis not present

## 2020-02-17 DIAGNOSIS — Z90722 Acquired absence of ovaries, bilateral: Secondary | ICD-10-CM | POA: Diagnosis not present

## 2020-02-17 DIAGNOSIS — M85851 Other specified disorders of bone density and structure, right thigh: Secondary | ICD-10-CM | POA: Diagnosis not present

## 2020-02-22 DIAGNOSIS — M1611 Unilateral primary osteoarthritis, right hip: Secondary | ICD-10-CM | POA: Diagnosis not present

## 2020-02-22 DIAGNOSIS — M25561 Pain in right knee: Secondary | ICD-10-CM | POA: Diagnosis not present

## 2020-03-07 DIAGNOSIS — M25551 Pain in right hip: Secondary | ICD-10-CM | POA: Diagnosis not present

## 2020-03-21 DIAGNOSIS — M25551 Pain in right hip: Secondary | ICD-10-CM | POA: Diagnosis not present

## 2020-03-28 DIAGNOSIS — M1611 Unilateral primary osteoarthritis, right hip: Secondary | ICD-10-CM | POA: Diagnosis not present

## 2020-04-19 DIAGNOSIS — H524 Presbyopia: Secondary | ICD-10-CM | POA: Diagnosis not present

## 2020-04-19 DIAGNOSIS — H43813 Vitreous degeneration, bilateral: Secondary | ICD-10-CM | POA: Diagnosis not present

## 2020-04-19 DIAGNOSIS — H353131 Nonexudative age-related macular degeneration, bilateral, early dry stage: Secondary | ICD-10-CM | POA: Diagnosis not present

## 2020-04-19 DIAGNOSIS — H52223 Regular astigmatism, bilateral: Secondary | ICD-10-CM | POA: Diagnosis not present

## 2020-04-19 DIAGNOSIS — H3562 Retinal hemorrhage, left eye: Secondary | ICD-10-CM | POA: Diagnosis not present

## 2020-04-19 DIAGNOSIS — H2513 Age-related nuclear cataract, bilateral: Secondary | ICD-10-CM | POA: Diagnosis not present

## 2020-04-19 DIAGNOSIS — H5203 Hypermetropia, bilateral: Secondary | ICD-10-CM | POA: Diagnosis not present

## 2020-05-08 DIAGNOSIS — E559 Vitamin D deficiency, unspecified: Secondary | ICD-10-CM | POA: Diagnosis not present

## 2020-05-08 DIAGNOSIS — M810 Age-related osteoporosis without current pathological fracture: Secondary | ICD-10-CM | POA: Diagnosis not present

## 2020-05-23 DIAGNOSIS — M1611 Unilateral primary osteoarthritis, right hip: Secondary | ICD-10-CM | POA: Diagnosis not present

## 2020-06-17 DIAGNOSIS — M1611 Unilateral primary osteoarthritis, right hip: Secondary | ICD-10-CM | POA: Diagnosis not present

## 2020-06-19 DIAGNOSIS — Z01812 Encounter for preprocedural laboratory examination: Secondary | ICD-10-CM | POA: Diagnosis not present

## 2020-06-19 DIAGNOSIS — M160 Bilateral primary osteoarthritis of hip: Secondary | ICD-10-CM | POA: Diagnosis not present

## 2020-06-22 DIAGNOSIS — Z885 Allergy status to narcotic agent status: Secondary | ICD-10-CM | POA: Diagnosis not present

## 2020-06-22 DIAGNOSIS — J449 Chronic obstructive pulmonary disease, unspecified: Secondary | ICD-10-CM | POA: Diagnosis not present

## 2020-06-22 DIAGNOSIS — K219 Gastro-esophageal reflux disease without esophagitis: Secondary | ICD-10-CM | POA: Diagnosis not present

## 2020-06-22 DIAGNOSIS — E785 Hyperlipidemia, unspecified: Secondary | ICD-10-CM | POA: Diagnosis not present

## 2020-06-22 DIAGNOSIS — M1611 Unilateral primary osteoarthritis, right hip: Secondary | ICD-10-CM | POA: Diagnosis not present

## 2020-06-22 DIAGNOSIS — M810 Age-related osteoporosis without current pathological fracture: Secondary | ICD-10-CM | POA: Diagnosis not present

## 2020-06-23 DIAGNOSIS — M1611 Unilateral primary osteoarthritis, right hip: Secondary | ICD-10-CM | POA: Diagnosis not present

## 2020-06-23 DIAGNOSIS — E785 Hyperlipidemia, unspecified: Secondary | ICD-10-CM | POA: Diagnosis not present

## 2020-06-23 DIAGNOSIS — M810 Age-related osteoporosis without current pathological fracture: Secondary | ICD-10-CM | POA: Diagnosis not present

## 2020-06-23 DIAGNOSIS — Z885 Allergy status to narcotic agent status: Secondary | ICD-10-CM | POA: Diagnosis not present

## 2020-06-23 DIAGNOSIS — K219 Gastro-esophageal reflux disease without esophagitis: Secondary | ICD-10-CM | POA: Diagnosis not present

## 2020-06-23 DIAGNOSIS — J449 Chronic obstructive pulmonary disease, unspecified: Secondary | ICD-10-CM | POA: Diagnosis not present

## 2020-06-27 DIAGNOSIS — Z96641 Presence of right artificial hip joint: Secondary | ICD-10-CM | POA: Diagnosis not present

## 2020-07-05 DIAGNOSIS — M1612 Unilateral primary osteoarthritis, left hip: Secondary | ICD-10-CM | POA: Diagnosis not present

## 2020-07-07 DIAGNOSIS — M1612 Unilateral primary osteoarthritis, left hip: Secondary | ICD-10-CM | POA: Diagnosis not present

## 2020-07-12 DIAGNOSIS — M1612 Unilateral primary osteoarthritis, left hip: Secondary | ICD-10-CM | POA: Diagnosis not present

## 2020-07-14 DIAGNOSIS — M1612 Unilateral primary osteoarthritis, left hip: Secondary | ICD-10-CM | POA: Diagnosis not present

## 2020-07-17 DIAGNOSIS — M1612 Unilateral primary osteoarthritis, left hip: Secondary | ICD-10-CM | POA: Diagnosis not present

## 2020-07-19 DIAGNOSIS — M1612 Unilateral primary osteoarthritis, left hip: Secondary | ICD-10-CM | POA: Diagnosis not present

## 2020-07-24 DIAGNOSIS — M1612 Unilateral primary osteoarthritis, left hip: Secondary | ICD-10-CM | POA: Diagnosis not present

## 2020-07-26 DIAGNOSIS — M1612 Unilateral primary osteoarthritis, left hip: Secondary | ICD-10-CM | POA: Diagnosis not present

## 2020-08-01 DIAGNOSIS — Z96641 Presence of right artificial hip joint: Secondary | ICD-10-CM | POA: Diagnosis not present

## 2020-09-07 DIAGNOSIS — D225 Melanocytic nevi of trunk: Secondary | ICD-10-CM | POA: Diagnosis not present

## 2020-09-07 DIAGNOSIS — L720 Epidermal cyst: Secondary | ICD-10-CM | POA: Diagnosis not present

## 2020-09-07 DIAGNOSIS — L821 Other seborrheic keratosis: Secondary | ICD-10-CM | POA: Diagnosis not present

## 2020-09-07 DIAGNOSIS — D2262 Melanocytic nevi of left upper limb, including shoulder: Secondary | ICD-10-CM | POA: Diagnosis not present

## 2020-09-07 DIAGNOSIS — D2261 Melanocytic nevi of right upper limb, including shoulder: Secondary | ICD-10-CM | POA: Diagnosis not present

## 2020-09-07 DIAGNOSIS — Z85828 Personal history of other malignant neoplasm of skin: Secondary | ICD-10-CM | POA: Diagnosis not present

## 2020-11-09 DIAGNOSIS — M810 Age-related osteoporosis without current pathological fracture: Secondary | ICD-10-CM | POA: Diagnosis not present

## 2020-11-09 DIAGNOSIS — E559 Vitamin D deficiency, unspecified: Secondary | ICD-10-CM | POA: Diagnosis not present

## 2020-11-16 DIAGNOSIS — M810 Age-related osteoporosis without current pathological fracture: Secondary | ICD-10-CM | POA: Diagnosis not present

## 2021-01-05 DIAGNOSIS — J42 Unspecified chronic bronchitis: Secondary | ICD-10-CM | POA: Diagnosis not present

## 2021-01-05 DIAGNOSIS — Z0000 Encounter for general adult medical examination without abnormal findings: Secondary | ICD-10-CM | POA: Diagnosis not present

## 2021-01-05 DIAGNOSIS — E7800 Pure hypercholesterolemia, unspecified: Secondary | ICD-10-CM | POA: Diagnosis not present

## 2021-01-05 DIAGNOSIS — K219 Gastro-esophageal reflux disease without esophagitis: Secondary | ICD-10-CM | POA: Diagnosis not present

## 2021-01-09 ENCOUNTER — Other Ambulatory Visit: Payer: Self-pay | Admitting: Internal Medicine

## 2021-01-12 ENCOUNTER — Other Ambulatory Visit: Payer: Self-pay | Admitting: Internal Medicine

## 2021-01-12 DIAGNOSIS — J42 Unspecified chronic bronchitis: Secondary | ICD-10-CM | POA: Diagnosis not present

## 2021-01-12 DIAGNOSIS — Z0000 Encounter for general adult medical examination without abnormal findings: Secondary | ICD-10-CM | POA: Diagnosis not present

## 2021-01-12 DIAGNOSIS — E7800 Pure hypercholesterolemia, unspecified: Secondary | ICD-10-CM | POA: Diagnosis not present

## 2021-01-12 DIAGNOSIS — M810 Age-related osteoporosis without current pathological fracture: Secondary | ICD-10-CM | POA: Diagnosis not present

## 2021-01-12 DIAGNOSIS — Z1211 Encounter for screening for malignant neoplasm of colon: Secondary | ICD-10-CM | POA: Diagnosis not present

## 2021-01-12 DIAGNOSIS — Z1389 Encounter for screening for other disorder: Secondary | ICD-10-CM | POA: Diagnosis not present

## 2021-01-12 DIAGNOSIS — Z1231 Encounter for screening mammogram for malignant neoplasm of breast: Secondary | ICD-10-CM

## 2021-01-30 DIAGNOSIS — H353131 Nonexudative age-related macular degeneration, bilateral, early dry stage: Secondary | ICD-10-CM | POA: Diagnosis not present

## 2021-01-30 DIAGNOSIS — H43813 Vitreous degeneration, bilateral: Secondary | ICD-10-CM | POA: Diagnosis not present

## 2021-01-30 DIAGNOSIS — H52223 Regular astigmatism, bilateral: Secondary | ICD-10-CM | POA: Diagnosis not present

## 2021-01-30 DIAGNOSIS — H3562 Retinal hemorrhage, left eye: Secondary | ICD-10-CM | POA: Diagnosis not present

## 2021-01-30 DIAGNOSIS — H524 Presbyopia: Secondary | ICD-10-CM | POA: Diagnosis not present

## 2021-01-30 DIAGNOSIS — Z1211 Encounter for screening for malignant neoplasm of colon: Secondary | ICD-10-CM | POA: Diagnosis not present

## 2021-01-30 DIAGNOSIS — H5203 Hypermetropia, bilateral: Secondary | ICD-10-CM | POA: Diagnosis not present

## 2021-01-30 DIAGNOSIS — H2513 Age-related nuclear cataract, bilateral: Secondary | ICD-10-CM | POA: Diagnosis not present

## 2021-02-19 ENCOUNTER — Ambulatory Visit
Admission: RE | Admit: 2021-02-19 | Discharge: 2021-02-19 | Disposition: A | Discharge status: Home / Self Care | Payer: Medicare PPO | Source: Ambulatory Visit | Attending: Internal Medicine | Admitting: Internal Medicine

## 2021-02-19 ENCOUNTER — Other Ambulatory Visit: Payer: Self-pay

## 2021-02-19 DIAGNOSIS — Z1231 Encounter for screening mammogram for malignant neoplasm of breast: Secondary | ICD-10-CM | POA: Insufficient documentation

## 2022-01-17 ENCOUNTER — Other Ambulatory Visit: Payer: Self-pay | Admitting: Internal Medicine

## 2022-01-17 DIAGNOSIS — Z1231 Encounter for screening mammogram for malignant neoplasm of breast: Secondary | ICD-10-CM

## 2022-01-18 ENCOUNTER — Other Ambulatory Visit: Payer: Self-pay | Admitting: Internal Medicine

## 2022-01-18 DIAGNOSIS — M810 Age-related osteoporosis without current pathological fracture: Secondary | ICD-10-CM

## 2022-02-20 ENCOUNTER — Ambulatory Visit
Admission: RE | Admit: 2022-02-20 | Discharge: 2022-02-20 | Disposition: A | Discharge status: Home / Self Care | Payer: Medicare PPO | Source: Ambulatory Visit | Attending: Internal Medicine | Admitting: Internal Medicine

## 2022-02-20 DIAGNOSIS — Z1231 Encounter for screening mammogram for malignant neoplasm of breast: Secondary | ICD-10-CM | POA: Diagnosis present

## 2023-01-24 ENCOUNTER — Other Ambulatory Visit: Payer: Self-pay | Admitting: Internal Medicine

## 2023-01-24 DIAGNOSIS — Z1231 Encounter for screening mammogram for malignant neoplasm of breast: Secondary | ICD-10-CM

## 2023-03-11 ENCOUNTER — Ambulatory Visit
Admission: RE | Admit: 2023-03-11 | Discharge: 2023-03-11 | Disposition: A | Discharge status: Home / Self Care | Payer: Medicare PPO | Source: Ambulatory Visit | Attending: Internal Medicine | Admitting: Internal Medicine

## 2023-03-11 DIAGNOSIS — Z1231 Encounter for screening mammogram for malignant neoplasm of breast: Secondary | ICD-10-CM | POA: Insufficient documentation

## 2023-11-11 ENCOUNTER — Other Ambulatory Visit: Payer: Self-pay | Admitting: Internal Medicine

## 2023-11-11 DIAGNOSIS — Z1231 Encounter for screening mammogram for malignant neoplasm of breast: Secondary | ICD-10-CM

## 2024-03-11 ENCOUNTER — Ambulatory Visit
Admission: RE | Admit: 2024-03-11 | Discharge: 2024-03-11 | Disposition: A | Discharge status: Home / Self Care | Source: Ambulatory Visit | Attending: Internal Medicine | Admitting: Internal Medicine

## 2024-03-11 DIAGNOSIS — Z1231 Encounter for screening mammogram for malignant neoplasm of breast: Secondary | ICD-10-CM | POA: Diagnosis present
